# Patient Record
Sex: Male | Born: 1950 | Race: White | Hispanic: No | Marital: Married
Health system: Southern US, Community
[De-identification: ages and names within clinical notes are randomized; demographics above are authoritative.]

## PROBLEM LIST (undated history)

## (undated) DIAGNOSIS — M129 Arthropathy, unspecified: Secondary | ICD-10-CM

## (undated) DIAGNOSIS — M1710 Unilateral primary osteoarthritis, unspecified knee: Secondary | ICD-10-CM

## (undated) DIAGNOSIS — M1711 Unilateral primary osteoarthritis, right knee: Secondary | ICD-10-CM

---

## 2011-05-10 LAB — PROTIME-INR
INR: 1 (ref 0.9–1.1)
Protime: 13 seconds (ref 11.6–14.4)

## 2011-05-10 LAB — APTT: aPTT: 28.5 seconds (ref 24.3–33.1)

## 2011-05-10 NOTE — Progress Notes (Signed)
[facility] PRE-SURGICAL TESTING INSTRUCTIONS     Date of Procedure 05/11/11 Time of Procedure 8:00 a.m.    PRIOR TO PROCEDURE DATE:  1. Arrange for someone to take you home and be with you after discharge since you cannot drive after receiving sedation. Please ensure it is someone we can share information with regarding your discharge.    2. Contact your doctor for advice if:       a. You are taking any blood thinners, aspirin, anti-inflammatory or vitamin E products.       b. There is a change in your physical state such as a cold, fever, rash, cuts, sores or infection, especially near your surgical site.    3. Do not drink alcohol the day before your surgery.    4. Please follow guidelines prior to surgery for diet, medication, or preparations as advised by your doctor.    5. FOLLOW INSTRUCTIONS FOR ARRIVAL TIME AS DIRECTED BY YOUR DOCTOR.  If your doctor does not give you a specific arrival time, please arrive at 6:00 a.m.    THE DAY OF YOUR PROCEDURE:  1. Please report to registration desk at the WEST entrance on the day of surgery.     2. DO NOT EAT OR DRINK ANYTHING AFTER MIDNIGHT. The only exception would be a small sip of water with any medications you were told to take the morning of surgery.         a. Medication instructions for the day of surgery: Take Losartan.       Physician signature______________________Date___________Time________    3. Do not swallow water when brushing teeth. No gum, candy, mints or ice chips. Refrain from smoking or at least decrease the amount.    4. Dress in loose, comfortable clothing appropriate for redressing after your procedure.  Do not wear jewelry, make-up, fingernail polish, lotion, powders or metal hairclips.    5. Dentures and glasses may need to be removed before surgery. Bring a case for your glasses or contacts. If you use a CPAP, please bring it with you the day of surgery.    6. Leave any valuables at home such as credit cards, cash, cell phones and jewelry.  The hospital will not be responsible for valuables that are not secured in the hospital safe.    7. You may bring a bag with personal items if you are to spend the night. Please have any large items you may need brought in by your family after your arrival to your hospital room.    8. Please bring a copy of any history and physical form, or medical reports your doctor may have given you.    9. If you have a Living Will or Durable Power of Attorney, please bring a copy on the day of surgery.    HOW WE KEEP YOU SAFE and WORK TO PREVENT SURGICAL SITE INFECTIONS:  1. Health care workers should always check your ID bracelet to verify your name and birth date. You will be asked many times to state your name, date of birth, and allergies.    2. Health care workers should always clean their hands with soap or alcohol gel before providing care to you. It is okay to ask anyone if they cleaned their hands before they touch you.    3. You will be actively involved in verifying the type of surgery you are having and ensuring the correct surgical site is confi rmed.    4. Do not shave near  where you will have surgery. Shaving with a razor can irritate your skin and make it easier to develop an infection. On the day of your procedure, any hair that needs to be removed near the surgical site will be 'clipped' by a healthcare worker using a special clippers designed to avoid skin irritation.    5. When you are in the operating room, your surgical site will be cleansed with a special soap and in most cases you will be given an antibiotic before the surgery begins.    AFTER YOUR PROCEDURE:  1. For comfort and safety, arrange to have someone at home with you for the first 24 hours after discharge.    2. You and your family will be given written instructions about your diet, activity, dressing care, medications, and return visits.    3. Always clean your hands before and after caring for your wound.    4. Mild nausea, headache, muscle  aches, sore throat, or fatigue may occur after anesthesia. Should any of these symptoms become severe, or should you notice any signs of infection, you should call your doctor.    SPECIAL INSTRUCTIONS     ADDITIONAL INFORMATION REVIEWED:  Yes Taking Control of Your Pain  Yes FAQs about "Surgical Site Infections "  Yes Total Joint Packet-Please bring this packet back  Yes Same Day Service Booklet on the day of your surgery  Yes Hibiclens Bathing Instructions       If you need to contact us for any reason, please call us at [phone]  [facility].05/10/2011 .10:01 AM    Instructions reviewed and copy given to patient during visit.

## 2011-05-10 NOTE — Anesthesia Pre-Procedure Evaluation (Signed)
Vernon Chen     Anesthesia Evaluation     Patient summary reviewed and Nursing notes reviewed    No history of anesthetic complications   Airway   Mallampati: I  TM distance: >3 FB  Neck ROM: full  Dental - normal exam     Pulmonary - negative ROS   Cardiovascular   (+) hypertension well controlled,     Neuro/Psych - negative ROS   GI/Hepatic/Renal    (+) GERD,     Endo/Other  (+) , arthritis (Degenerative arthritis left knee)    Comments: Obese  Abdominal               Surgeon: Army Melia. 05/11/11. Procedure: Left knee total arthroplasty    Allergies: Review of patient's allergies indicates no known allergies.    NPO Status:                              Anesthesia Plan    ASA 3     general     intravenous induction   Anesthetic plan and risks discussed with patient.          Lesly Rubenstein  05/10/2011

## 2011-05-10 NOTE — Progress Notes (Signed)
Identify the learner who is being assessed for education: Patient                       Ability to Learn:  Exhibits ability to grasp concepts and respond to questions: High  Ready to Learn: Yes  calm   Preferred Method of Learning:  written  Barriers to Learning: Verbalizes interest  Special Considerations due to cultural, religious, spiritual beliefs:  No  Language:  English  Language Interpreter:  No    [facility] Perioperative Care Plan Goals  [x]  Appropriate evaluation / integration of data as delineated by ASPAN Standards of Perianesthesia Nursing Practice    Pain scale and pain management   [x] Patient verbalizes understanding of pain scale and pain management  [x] Pre-operative determination of patient???s anticipated Post-Operative pain goal:   less than 4 of 10 on 10 point scale post op goal  []  Other     Medication(s) - Compliance with preop medication instructions  [x]  Patient verbalizes understanding of preop medications (see [facility] Presurgical Instructions)    Instructions, Pre op                                                                                            [x]  Patient verbalizes understanding of presurgical instructions as reviewed with phone interview nurse or in-person nurse review    Fall Risk Potential, Preoperatively                                                                                   [x] No preoperative risk identified  [] Preop risk identified:        [] Sensory deficit        [] Motor deficit        [] Balance problem        [] Home medication        [] Uses assistive device    Goal(s) for fall prevention:  [] Prevent fall or injury by requesting assistance with activities of daily living  [] Patient / Significant other verbalizes understanding the need to call for assistance prior to getting out of bed during hospitalization      Infection Precautions                                                                                            [x]   Patient understands implementation of infection precautions (see [facility] Presurgical Instructions)    Patient Safety  [x]  Patient identification  verified  [x]  Site verified    Instructions - Discharge Planning for Outpatients  [x]  Patient / significant other voices understanding of home care and follow up procedures  [x]  Encourage patient / significant other to review discharge instructions the day after procedure due to sedation on day of surgery    Anticipated Special Needs upon discharge:        []  Cooling device        []  Crutches       []  Walker        []  Wound Support device        []  Drain        []  Other       Instructions - Discharge Planning for Admitted patients  [x]  Patient / significant other understands plan for admission after surgery  [x]  Patient / significant other understands plan for anticipated discharge dispostion        05/10/2011 10:00 AM Jannifer Hick, RN

## 2011-05-10 NOTE — Progress Notes (Signed)
Notified Lupita Leash, Dr. Bryson Ha office, patient has been taking Mobic daily through today and Fish Oil 500 mg every other day.

## 2011-05-11 ENCOUNTER — Inpatient Hospital Stay
Admit: 2011-05-11 | Disposition: A | Discharge status: Home / Self Care | Source: Ambulatory Visit | Attending: Sports Medicine | Admitting: Sports Medicine

## 2011-05-11 LAB — PROTIME-INR
INR: 1 (ref 0.9–1.1)
Protime: 13.4 seconds (ref 11.6–14.4)

## 2011-05-11 LAB — APTT: aPTT: 27 seconds (ref 24.3–33.1)

## 2011-05-11 MED ORDER — ONDANSETRON HCL 4 MG/2ML IJ SOLN
4 | Freq: Once | INTRAMUSCULAR | Status: DC | PRN
Start: 2011-05-11 — End: 2011-05-11

## 2011-05-11 MED ORDER — MIDAZOLAM HCL 2 MG/2ML IJ SOLN
2 | Freq: Once | INTRAMUSCULAR | Status: AC | PRN
Start: 2011-05-11 — End: 2011-05-11

## 2011-05-11 MED ORDER — PNEUMOCOCCAL 23-POLYVALENT VACC INJ
25 | Freq: Once | INTRAMUSCULAR | Status: DC
Start: 2011-05-11 — End: 2011-05-12

## 2011-05-11 MED ADMIN — famotidine (PEPCID) injection 20 mg: INTRAVENOUS | @ 11:00:00 | NDC 63323073912

## 2011-05-11 MED ADMIN — pantoprazole (PROTONIX) tablet 40 mg: ORAL | @ 20:00:00 | NDC 00008060704

## 2011-05-11 MED ADMIN — warfarin (COUMADIN) tablet 5 mg: ORAL | @ 22:00:00 | NDC 00056017201

## 2011-05-11 MED ADMIN — lactated ringers infusion: INTRAVENOUS | @ 11:00:00 | NDC 00338011704

## 2011-05-11 MED ADMIN — midazolam (VERSED) injection 2 mg: INTRAVENOUS | @ 12:00:00 | NDC 63323041112

## 2011-05-11 MED ADMIN — metoclopramide (REGLAN) injection 10 mg: INTRAVENOUS | @ 11:00:00 | NDC 00409341401

## 2011-05-11 MED ADMIN — dexamethasone (DECADRON) injection 10 mg: INTRAVENOUS | @ 17:00:00 | NDC 63323016505

## 2011-05-11 MED ADMIN — dextrose 5 % and 0.45 % NaCl with KCl 20 mEq infusion: INTRAVENOUS | @ 16:00:00 | NDC 00338067104

## 2011-05-11 MED ADMIN — LORazepam (ATIVAN) 2 MG/ML injection: INTRAVENOUS | @ 15:00:00 | NDC 17478004001

## 2011-05-11 MED ADMIN — ceFAZolin (ANCEF) 2 g in dextrose 5% 100 mL IVPB: INTRAVENOUS | @ 20:00:00 | NDC 09999990046

## 2011-05-11 MED ADMIN — ceFAZolin (ANCEF) 2 g in dextrose 5% 100 mL IVPB: INTRAVENOUS | @ 12:00:00 | NDC 09999990046

## 2011-05-11 MED ADMIN — morphine (PF) injection 4 mg: 4 mg | INTRAVENOUS | @ 18:00:00 | NDC 00409189101

## 2011-05-11 MED ADMIN — HYDROmorphone (DILAUDID) injection 0.4 mg: 0.2 mg | INTRAVENOUS | @ 16:00:00 | NDC 00409128331

## 2011-05-11 MED FILL — CEFAZOLIN 2000 MG IN D5W 100 ML IVPB: Qty: 2

## 2011-05-11 MED FILL — FENTANYL CITRATE 0.05 MG/ML IJ SOLN: 0.05 MG/ML | INTRAMUSCULAR | Qty: 5

## 2011-05-11 MED FILL — METOCLOPRAMIDE HCL 5 MG/ML IJ SOLN: 5 mg/mL | INTRAMUSCULAR | Qty: 2

## 2011-05-11 MED FILL — PNEUMOVAX 23 25 MCG/0.5ML IJ INJ: 25 MCG/0.5ML | INTRAMUSCULAR | Qty: 0.5

## 2011-05-11 MED FILL — GENTAMICIN SULFATE 40 MG/ML IJ SOLN: 40 MG/ML | INTRAMUSCULAR | Qty: 6

## 2011-05-11 MED FILL — ZEMURON 50 MG/5ML IV SOLN: 50 MG/5ML | INTRAVENOUS | Qty: 5

## 2011-05-11 MED FILL — MORPHINE SULFATE (PF) 10 MG/ML IV SOLN: 10 MG/ML | INTRAVENOUS | Qty: 1

## 2011-05-11 MED FILL — EPHEDRINE SULFATE 50 MG/ML IJ SOLN: 50 MG/ML | INTRAMUSCULAR | Qty: 1

## 2011-05-11 MED FILL — KCL IN DEXTROSE-NACL 20-5-0.45 MEQ/L-%-% IV SOLN: INTRAVENOUS | Qty: 1000

## 2011-05-11 MED FILL — VANCOMYCIN HCL 1000 MG IV SOLR: 1000 MG | INTRAVENOUS | Qty: 1

## 2011-05-11 MED FILL — MIDAZOLAM HCL 2 MG/2ML IJ SOLN: 2 mg/mL | INTRAMUSCULAR | Qty: 2

## 2011-05-11 MED FILL — LIDOCAINE HCL (CARDIAC) 20 MG/ML IV SOLN: 20 MG/ML | INTRAVENOUS | Qty: 5

## 2011-05-11 MED FILL — MORPHINE SULFATE (PF) 1 MG/ML IJ SOLN: 1 MG/ML | INTRAMUSCULAR | Qty: 5

## 2011-05-11 MED FILL — CYKLOKAPRON 100 MG/ML IV SOLN: 100 MG/ML | INTRAVENOUS | Qty: 10

## 2011-05-11 MED FILL — PROTONIX 40 MG PO TBEC: 40 mg | ORAL | Qty: 1

## 2011-05-11 MED FILL — FAMOTIDINE 10 MG/ML IV SOLN: 10 mg/mL | INTRAVENOUS | Qty: 2

## 2011-05-11 MED FILL — PROPOFOL 10 MG/ML IV EMUL: 10 MG/ML | INTRAVENOUS | Qty: 50

## 2011-05-11 MED FILL — MORPHINE SULFATE (PF) 4 MG/ML IV SOLN: 4 mg/mL | INTRAVENOUS | Qty: 1

## 2011-05-11 MED FILL — COUMADIN 5 MG PO TABS: 5 mg | ORAL | Qty: 1

## 2011-05-11 MED FILL — DEXAMETHASONE SODIUM PHOSPHATE 4 MG/ML IJ SOLN: 4 MG/ML | INTRAMUSCULAR | Qty: 5

## 2011-05-11 MED FILL — ANECTINE 20 MG/ML IJ SOLN: 20 MG/ML | INTRAMUSCULAR | Qty: 10

## 2011-05-11 MED FILL — HYDROMORPHONE HCL 2 MG/ML IJ SOLN: 2 MG/ML | INTRAMUSCULAR | Qty: 1

## 2011-05-11 MED FILL — PHENYLEPHRINE HCL 10 MG/ML IJ SOLN: 10 MG/ML | INTRAMUSCULAR | Qty: 1

## 2011-05-11 MED FILL — LORAZEPAM 2 MG/ML IJ SOLN: 2 mg/mL | INTRAMUSCULAR | Qty: 1

## 2011-05-11 MED FILL — KCL IN DEXTROSE-NACL 20-5-0.45 MEQ/L-%-% IV SOLN: 20-5-0.45 MEQ/L-%-% | INTRAVENOUS | Qty: 1000

## 2011-05-11 NOTE — Plan of Care (Signed)
Identify the learner who is being assessed for education: Patient  Ability to Learn:  Exhibits ability to grasp concepts and respond to questions: High  Ready to Learn: Yes  calm  Preferred Method of Learning:  verbal  Barriers to Learning: Verbalizes interest  Special Considerations due to cultural, religious, spiritual beliefs:  no  Language: [place]  Language Interpreter: no    [facility] Perioperative Care Plan Goals  [x] Appropriate evaluation / integration of data as delineated by ASPAN Standards of Perianesthesia Nursing Practice.    Pain scale and pain management  [x] Patient will verbalize understanding of pain scale and pain management.  [x] Pre-operative determination of patient???s anticipated Post-Operative pain goal: 4 of 10 on 10 point scale post op goal  [x] Patient will verbalize plan for pain management  [] Other     Compliance with Pre-op Instructions - Patient reports compliance with:  [x] Taking prescribed home meds before arrival  [x] Surgical prep instructions specific to the patient's surgery    Fall Risk - PreOperatively   [] No preoperative risk identified  [x] Preop risk identified:     []  Sensory deficit     []  Motor deficit     []  Balance problem     []  Home medication     [] Uses assistive device to ambulate  [x] Due to Perioperative medication administration    Goal(s) for fall prevention:  [x]  Prevent fall or injury by use of encouraging call light for assistance, side rails, and assisting with activity.  [x] Patient / Significant other verbalize understanding of need to call for assistance prior to getting out of bed.    Infection Precautions                                                                                                   [x]  Patient understands implementation of infection precautions  [x]  Handwashing, skin prep prior to IV insertion, hair clipped at surgical site if needed  [x]  Pre op antibiotic, if ordered    Patient Safety  [x]  Patient identification  [x]  Site  verification (See Universal Protocol Checklist)  [x]  General Safety precautions  [x]  Side rails up, bed/stretcher low position and wheels locked  [x]  Call light within reach  [x]  Patient instructed to call for assistance prior to getting out of bed    Instructions - Discharge planning for Outpatients  [x]  Patient / significant other voices understanding of home care and follow up procedures.  Anticipated Special Needs upon discharge:  []  Cooling device  []  Crutches  []  Walker  []  Wound Support device   [] Drain  [] Other   []  See [facility] Discharge Instructions    Instructions - Discharge planning for Admitted Patients  [x]  Patient / Significant other understands plan for admission after surgery  []  Patient / Significant other understands plan for anticipated discharge disposition                   05/11/2011 6:30 AM Vernon Scarlet, RN

## 2011-05-11 NOTE — Progress Notes (Signed)
Ancef 2 gm sent to OR

## 2011-05-11 NOTE — Progress Notes (Signed)
Pt took Coumadin last night as instructed per Dr Army Melia.

## 2011-05-11 NOTE — Anesthesia Pre-Procedure Evaluation (Addendum)
Vernon Chen    Anesthesia Evaluation     Chart reviewed.   Talked with pt.'s wife & daughter in [place].  Pt. Snoring after IV Versed.  Possibly has undiagnosed sleep apnea.  Snores at night & is tired during day per wife.  Otherwise clinically unchanged since PAT.    Allergies: Review of patient's allergies indicates no known allergies.    NPO Status: Time of last liquid consumption: 0400 (sip H2O with meds)                       Time of last solid food consumption: 2130    Anesthesia Plan  GA  Knows what to expect with plan, risk & personnel involved.  Agrees with plan.    Vernon Chen  05/11/2011

## 2011-05-11 NOTE — Consults (Addendum)
Internal Medicine Consult H&P      Patient Name: Vernon Chen                 Room/Bed: 3E-3104/3104-01   Today's Date: May 11, 2011  Date of Birth: [birth year]           60 y.o. male  Attending Physician: Dr. Ermalene Postin  PCP: No primary provider on file.  Admitted for: LEFT KNEE OSTEOARTHRITIS   Primary Diagnosis: L knee OA  Admission Date: 05/11/2011  5:23 AM  Hospital Day: 1      CC: Post-op Medical Mgmt s/p L-TKR      HPI:  The pt is a 60-yo WM w/a PMH s/f severe OA of both knees who presented to [place] today for a planned/elective L-TKR by Dr. Army Melia. He states that he used to be an avid bicycler, but due to the knee OA, he has not been able to ride in the past year and has gained considerable weight, which he says is affecting other aspects of his health, such as snoring at night. He states that he has had knee problems for at least the past 7 years. He states that in August of 2011, he had L knee arthroscopy, which was complicated by septic arthritis, leading to septic shock and a stay in the ICU for 3 weeks. He states that he lives in the [place], [place] area and is here in [place] specifically for the surgery with Dr. Army Melia. He states that he will be here for another two weeks for intensive PT. He also has a hx of HTN, HLD, b/l tinnitus, and GERD. He is feeling well post-op, with no major complaints. He states he has a hx of a heart murmur since childhood, but was evaluated by a cardiologist and was reassured that it was just a "functional murmur." I did not detect the murmur on exam.      PMH:  Past Medical History   Diagnosis Date   ??? Hypertension    ??? Hyperlipidemia    ??? Murmur    ??? GERD (gastroesophageal reflux disease)          PSH:  Past Surgical History   Procedure Date   ??? Knee arthroscopy      also revision for sepsis   ??? Hand surgery    ??? Tumor removal    ??? Colonoscopy    ??? Knee surgery 05/11/2011     LEFT TOTAL KNEE ARTHROPLASTY                   Social Hx:  History     Social History   ???  Marital Status: Married     Spouse Name: N/A     Number of Children: N/A   ??? Years of Education: N/A     Occupational History   ??? Not on file.     Social History Main Topics   ??? Smoking status: Never Smoker    ??? Smokeless tobacco: Not on file   ??? Alcohol Use:    ??? Drug Use:    ??? Sexually Active:      Other Topics Concern   ??? Not on file     Social History Narrative   ??? No narrative on file         Family Hx:  History reviewed. No pertinent family history.        MEDICATIONS:     Continuous Infusions:      ??? lactated ringers 125  mL/hr at 05/11/11 0701   ??? dextrose 5% and 0.45% NaCl with KCl 20 mEq 1,000 mL (05/11/11 1138)       Scheduled Medications:      ??? tranexamic (CYKLOKAPRON) irrigation  1,000 mg Irrigation Once   ??? ortho mix (with morphine) injection   Injection On Call   ??? sodium chloride (PF)  10 mL Intravenous Q12H [facility]   ??? ceFAZolin (ANCEF) IVPB  2 g Intravenous Q8H   ??? docusate sodium  100 mg Oral BID   ??? warfarin  5 mg Oral Daily   ??? dexamethasone  10 mg Intravenous Q12H   ??? dexamethasone  6 mg Intravenous Q12H   ??? losartan  50 mg Oral Daily   ??? simvastatin  40 mg Oral Nightly   ??? HYDROmorphone       ??? pantoprazole  40 mg Oral QAM AC   ??? pneumococcal vaccine  0.5 mL Intramuscular Once       PRN Medications:      sodium chloride (PF) 10 mL PRN   acetaminophen 650 mg Q4H PRN   ondansetron 4 mg Q6H PRN   oxyCODONE-acetaminophen 1 tablet Q4H PRN   oxyCODONE-acetaminophen 2 tablet Q4H PRN   morphine 4 mg Q2H PRN   magnesium hydroxide 10 mL Daily PRN   lorazepam 0.5 mg Q4H PRN        Home Medications:   Prior to Admission medications    Medication Sig Start Date End Date Taking? Authorizing Provider   Meloxicam (MOBIC PO) Take 15 mg by mouth daily.   Yes Historical Provider, MD   LOSARTAN POTASSIUM PO Take 50 mg by mouth daily.   Yes Historical Provider, MD   SIMVASTATIN PO Take 40 mg by mouth daily.   Yes Historical Provider, MD   Omega-3 Fatty Acids (FISH OIL) 500 MG CAPS Take  by mouth daily.     Yes  Historical Provider, MD   omeprazole (PRILOSEC) 20 MG capsule Take 20 mg by mouth daily.     Yes Historical Provider, MD   Multiple Vitamin (MULTIVITAMIN PO) Take  by mouth daily.      Historical Provider, MD       Allergies: Review of patient's allergies indicates no known allergies.  -------------------------------------------------------------------------------------------------------------------  VITAL SIGNS:    Current Vitals:   Filed Vitals:    05/11/11 1854   BP: 113/74   Pulse: 97   Temp: 98.2 ??F (36.8 ??C)   Resp: 12     Temperature Range: Temp  Avg: 98.1 ??F (36.7 ??C)  Min: 97.2 ??F (36.2 ??C)  Max: 98.7 ??F (37.1 ??C)    Respiratory Rate Range: Resp  Avg: 14.9   Min: 12   Max: 18     Pulse Range: Pulse  Avg: 98.3   Min: 68   Max: 111     Blood Pressure Range:  Systolic (24hrs), Avg:131 mmHg, Min:113 mmHg, Max:168 mmHg  ;Diastolic (24hrs), Avg:82 mmHg, Min:69 mmHg, Max:93 mmHg      Pulse Ox Range: SpO2  Avg: 96.3 %  Min: 94 %  Max: 100 %    Vitals of Past 24 Hours: Patient Vitals for the past 24 hrs:   BP Temp Temp src Pulse Resp SpO2 Height Weight   05/11/11 1854 113/74 mmHg 98.2 ??F (36.8 ??C) Oral 97  12  95 % - -   05/11/11 1534 116/69 mmHg 97.2 ??F (36.2 ??C) Oral 84  18  95 % - -   05/11/11 1329 135/88  mmHg 98 ??F (36.7 ??C) Oral 105  16  95 % - -   05/11/11 1300 124/88 mmHg 98 ??F (36.7 ??C) Oral 111  18  97 % - -   05/11/11 1230 138/72 mmHg - - 109  12  96 % - -   05/11/11 1200 138/86 mmHg - - 109  13  97 % - -   05/11/11 1145 126/86 mmHg - - 108  12  95 % - -   05/11/11 1130 131/83 mmHg - - 105  14  96 % - -   05/11/11 1115 128/79 mmHg - - 107  16  97 % - -   05/11/11 1101 129/81 mmHg 98.7 ??F (37.1 ??C) Temporal 106  14  100 % - -   05/11/11 0736 - - - - - 98 % - -   05/11/11 0700 - - - 68  16  - - -   05/11/11 0606 168/93 mmHg 98.2 ??F (36.8 ??C) Oral 70  18  94 % - -   05/11/11 0559 - - - - - - 5' 8.5" (1.74 m) 249 lb (112.946 kg)    -------------------------------------------------------------------------------------------------------------------  INS & OUTS:      Intake/Output Summary (Last 24 hours) at 05/11/11 1936  Last data filed at 05/11/11 1900   Gross per 24 hour   Intake   2545 ml   Output    850 ml   Net   1695 ml       I/O last 3 completed shifts:  In: 2345 [P.O.:20; I.V.:2325]  Out: 850 [Urine:750; Blood:100]    I/O this shift:  In: 200 [P.O.:200]  Out: -     Diet: General    Net Fluid Balance Since Admission: +1695 mL.  -------------------------------------------------------------------------------------------------------------------  PHYSICAL EXAM:    General: obese middle-aged WM lying in bed in NAD.  HEENT: PERRL, EOMI.  Neck: no JVD.  Lungs: CTAB.  Heart: tachy but regular, no M.  Abdomen: obese, NTND.  Extremities: L leg in ACE wrap. R leg in SCD.  Skin: no rashes, bruises, or lacerations.  Neurologic: grossly intact  Psych: A&O  -------------------------------------------------------------------------------------------------------------------  LABS:    INR:   Recent Labs   Basename 05/11/11 1345 05/10/11 1620    INR 1.0 1.0     APTT 27.0    -------------------------------------------------------------------------------------------------------------------  MICROBIOLOGY:  - none  -------------------------------------------------------------------------------------------------------------------  RADIOLOGY:  - none  -------------------------------------------------------------------------------------------------------------------  CARDIOLOGY:  - none  -------------------------------------------------------------------------------------------------------------------      Assessment and Plan:     OJAS COONE is a 60 y.o. male, who was admitted for elective L-TKR.      Post-Op Pain Mgmt and DVT Ppx.  - per ortho      Tachycardia.  - HR has been ~100. Etiology not clear. No EKG. Pt does not appear volume-depleted. Has been  trending down. Perhaps post-op pain or stress.  - monitor with q4h vitals.      HTN.  - continue home losartan.      HLD.  - continue home Zocor.      GERD.  - home Prilosec 20 mg has been replaced with hospital-ordered Protonix 40 mg PO.      Discussed with Dr. Ermalene Postin.  -------------------------------------------------------------------------------------------------------------------  Cecille Po, MD, PGY2  Pager: [phone]  05/11/2011 7:36 PM  Hospitalist Progress Note  05/11/2011 10:12 PM      Addendum to AI (serving as scribe)/Resident H& P:  Pt seen,examined and evaluated the patient with AI/resident. I have reviewed the current history, physical findings, labs and assessment and plan and agree with note as documented including:    Tachycardia resolved. Continue home medications for hypertension, hyperlipidemia and GERD.     Arvin Collard M.D

## 2011-05-11 NOTE — Progress Notes (Signed)
Pt admitted to PACU from OR., with oral airway in place. Awakens quickly airway removed. O2 5lnc then reduced to NC 3L sats 94%. Left foot PWD, 2+ pedal pulse, 3/5 dorsi/plantar flexion.

## 2011-05-11 NOTE — Progress Notes (Signed)
[facility] CRNA called reported she did NOT give Decadron 10 mg in OR and requested it be given in PACU. Decadron10mg  IVP given at 1300, RN Boneta Lucks on 3E notified will stagger dose scheduled on floor for 2000pm  Till 0100 (12 hrs) after 1st dose.

## 2011-05-11 NOTE — Anesthesia Post-Procedure Evaluation (Signed)
Anesthesia Post-op Note    Patient: Vernon Chen  Dr. Mendel Corning,   05/11/11    Procedure(s) Performed: Vernon Chen. Total Knee Arthroplasty    Anesthesia type: General      Post-op assessment:  Anesthetic Problems: no   Last Vitals:  height is 5' 8.5" (1.74 m) and weight is 249 lb (112.946 kg). His temporal temperature is 98.7 ??F (37.1 ??C). His blood pressure is 129/81 and his pulse is 106. His respiration is 14 and oxygen saturation is 100%.   Cardiovascular System Stable: yes  Respiratory Function: Airway Patent yes  ETT no  Ventilator no  Level of consciousness: awake and oriented  Post-op pain: Adequate analgesia  Hydration Adequate: yes  Nausea/Vomiting:no          Kaisa Wofford  11:27 AM

## 2011-05-11 NOTE — Brief Op Note (Signed)
Brief Postoperative Note    Vernon Chen  Date of Birth:  March 31, 1951  [id number]    Pre-operative Diagnosis: left knee osteoarthrtitis    Post-operative Diagnosis: Same    Procedure: left total knee arthroplasty    Anesthesia: general    Surgeons/Assistants: Dr. Army Melia, Dr. Ysidro Evert    Estimated Blood Loss: less than 50     Complications: none    Specimens: were not obtained      Vernon Chen

## 2011-05-11 NOTE — Progress Notes (Signed)
Pt awakened for reassessment. Left foot PWD, 2+ pulse,+movement, + sensation, 5/5 plantar dorsi flexion. VVV, chaneged to every 30 min BP. Wife and daughter will be phoned when pt room ready.

## 2011-05-11 NOTE — Progress Notes (Signed)
PACU Transfer Note    Filed Vitals:    05/11/11 1300   BP: 124/88   Pulse: 111   Temp: 98 ??F (36.7 ??C)   Resp: 18         Intake/Output Summary (Last 24 hours) at 05/11/11 1327  Last data filed at 05/11/11 1256   Gross per 24 hour   Intake   2045 ml   Output    100 ml   Net   1945 ml       Pain assessment:  present - adequately treated  Pain Level: 4 (pt sleeping/snoring)    Report called to Receiving unit RN.    05/11/2011 1:27 PM         PACU Transfer Note    Filed Vitals:    05/11/11 1300   BP: 124/88   Pulse: 111   Temp: 98 ??F (36.7 ??C)   Resp: 18         Intake/Output Summary (Last 24 hours) at 05/11/11 1327  Last data filed at 05/11/11 1256   Gross per 24 hour   Intake   2045 ml   Output    100 ml   Net   1945 ml       Pain assessment:  present - adequately treated  Pain Level: 4 (pt sleeping/snoring)    Report called to Receiving unit RN. Larita Fife and Boneta Lucks    05/11/2011 1:27 PM

## 2011-05-11 NOTE — Plan of Care (Signed)
Problem: Falls - Risk of  Goal: Absence of falls  Outcome: Ongoing  Free from injury or falls at this time, fall precautions in place, Morse Fall Risk: High (45 and higher), bed alarm on, reoriented to room and call light, reminded not to get up without assistance, call light in reach, will continue to monitor   Geralyn Corwin, RN       Problem: Pain Control  Goal: Maintain pain level at or below patient's acceptable level (or 5 if patient is unable to determine acceptable level)  Outcome: Ongoing  C/o left knee pain . Given morphine and reported pain level less afterwards. Will monitor pain level.  Geralyn Corwin, RN  05/11/2011.3:35 PM        Problem: Neurological  Goal: Maximum potential motor/sensory/cognitive function  Outcome: Ongoing  Denies numbness and tingling in left extremity. circ check within normal. Will monitor neurovascular checks. Has voided about urine already.   Geralyn Corwin, RN  05/11/2011.3:37 PM        Problem: Skin Integrity/Risk  Goal: Wound healing  Outcome: Ongoing  Dressing cdi to left knee and ice to site.monitor dressing for drainage.  Geralyn Corwin, RN  05/11/2011.3:38 PM

## 2011-05-11 NOTE — Progress Notes (Signed)
Room ready, pt awakened notified of transfer returns immed to sleep. Assessment stable. Left leg ted with family at hotel.

## 2011-05-11 NOTE — H&P (Signed)
05/11/2011   Orthopedic H & P Update                    (  < 30 days since last complete H & P )                   Patient Vernon Chen/ 05/11/2011                         Diagnosis: Increasing / worsening & evolving, non-traumatic Osteoarthritic Pain, despite  more conservative modalities, including  oral analgesics / ( LEFT knee joint  ).                                 #2. Previous history of " septic LEFT knee, treated with IV-ABX @ [facility], several years ago."         PMHX:   Past Medical History   Diagnosis Date   . Hypertension    . Hyperlipidemia    . Murmur    . GERD (gastroesophageal reflux disease)         PSHX:   Past Surgical History   Procedure Date   . Knee arthroscopy      also revision for sepsis   . Hand surgery    . Tumor removal    . Colonoscopy        Social Hx:  reports that he has never smoked. He does not have any smokeless tobacco history on file.    Occupational Impact:                         Home Meds:   Prior to Admission medications    Medication Sig Start Date End Date Taking? Authorizing Provider   Meloxicam (MOBIC PO) Take 15 mg by mouth daily.   Yes Historical Provider, MD   LOSARTAN POTASSIUM PO Take 50 mg by mouth daily.   Yes Historical Provider, MD   SIMVASTATIN PO Take 40 mg by mouth daily.   Yes Historical Provider, MD   Omega-3 Fatty Acids (FISH OIL) 500 MG CAPS Take  by mouth daily.     Yes Historical Provider, MD   omeprazole (PRILOSEC) 20 MG capsule Take 20 mg by mouth daily.     Yes Historical Provider, MD   Multiple Vitamin (MULTIVITAMIN PO) Take  by mouth daily.      Historical Provider, MD                          Allergies: Review of patient's allergies indicates no known allergies.                         Exam: NAD                      VS-  BP 168/93  Pulse 70  Temp(Src) 98.2 F (36.8 C) (Oral)  Resp 18  Ht 5' 8.5" (1.74 m)  Wt 249 lb (112.946 kg)  BMI 37.31 kg/m2  SpO2 94%              General:  Alert and aware of today's  Pending procedure.                   Heent: Atraumatic   / no  acute changes.                   Neck: Supple, midline trachaea, normal swallowing observed.                   C V:  RRR, no Murmurs.             Lungs: CTA, No wheezes, Ronchi, or Crackles.                    Abdomen:  + BS, Soft, ND, NT and no appliances.      Skin: Warm / Dry. No skin tears.                      Extremities: Symmetrical, no edema, palpable bilateral distal pulses.                  Neurologic: Pupils =, round, clear voice and patient expresses understanding of questions / pending procedure.                       Today's labs: CBC:   No results found for this basename: WBC, RBC, HGB, HCT, MCV, MCH, MCHC, RDW, PLT, MPV     CMP:    No results found for this basename: NA, K, CL, CO2, BUN, CREATININE, GFRAA, AGRATIO, LABGLOM, GLUCOSE, GLU, PROT, LABALBU, CALCIUM, BILITOT, ALKPHOS, AST, ALT                  Assessment: 60 y.o. / male, non-smoker,  Osteoarthritic pain / < ing  ROM, painful ( joint extension / flexion ), rotational  and  weight-bearing pain @ ( LEFT knee joint ).              Plan: (  LEFT ) ( Knee   ) ( Arthroplasty ) procedure, today.      Consults for PT / OT / Social services and Internal Medicine may - / can be requested by surgical services.              Comment: Continue Health Care Coordination / Maintenance / Monitoring, Including ( > BP, wt mgmt ) with  patient's PCP - Advised.                 Cain Sieve, [place]              05/11/2011   Orthopedic H & P Update                    (  < 30 days since last complete H & P )                   Patient Vernon Chen/ 05/11/2011                         Diagnosis: Increasing / worsening & evolving, non-traumatic Osteoarthritic Pain, despite  more conservative modalities, including  oral analgesics / (   ).                                      PMHX:   Past Medical History   Diagnosis Date   . Hypertension    . Hyperlipidemia    . Murmur    . GERD (gastroesophageal reflux disease)  PSHX:    Past Surgical History   Procedure Date   . Knee arthroscopy      also revision for sepsis   . Hand surgery    . Tumor removal    . Colonoscopy        Social Hx:  reports that he has never smoked. He does not have any smokeless tobacco history on file.    Occupational Impact:                         Home Meds:   Prior to Admission medications    Medication Sig Start Date End Date Taking? Authorizing Provider   Meloxicam (MOBIC PO) Take 15 mg by mouth daily.   Yes Historical Provider, MD   LOSARTAN POTASSIUM PO Take 50 mg by mouth daily.   Yes Historical Provider, MD   SIMVASTATIN PO Take 40 mg by mouth daily.   Yes Historical Provider, MD   Omega-3 Fatty Acids (FISH OIL) 500 MG CAPS Take  by mouth daily.     Yes Historical Provider, MD   omeprazole (PRILOSEC) 20 MG capsule Take 20 mg by mouth daily.     Yes Historical Provider, MD   Multiple Vitamin (MULTIVITAMIN PO) Take  by mouth daily.      Historical Provider, MD                          Allergies: Review of patient's allergies indicates no known allergies.                         Exam: NAD                      VS-  BP 168/93  Pulse 70  Temp(Src) 98.2 F (36.8 C) (Oral)  Resp 18  Ht 5' 8.5" (1.74 m)  Wt 249 lb (112.946 kg)  BMI 37.31 kg/m2  SpO2 94%              General:  Alert and aware of today's  Pending procedure.                  Heent: Atraumatic   / no acute changes.                   Neck: Supple, midline trachaea, normal swallowing observed.                   C V:  RRR, no Murmurs.             Lungs: CTA, No wheezes, Ronchi, or Crackles.                    Abdomen:  + BS, Soft, ND, NT and no appliances.      Skin: Warm / Dry. No skin tears.                      Extremities: Symmetrical, no edema, palpable bilateral distal pulses.                  Neurologic: Pupils =, round, clear voice and patient expresses understanding of questions / pending procedure.                       Today's labs: CBC:   No results found for this basename: WBC, RBC,  HGB, HCT, MCV, MCH, MCHC, RDW, PLT, MPV     CMP:    No results found for this basename: NA, K, CL, CO2, BUN, CREATININE, GFRAA, AGRATIO, LABGLOM, GLUCOSE, GLU, PROT, LABALBU, CALCIUM, BILITOT, ALKPHOS, AST, ALT                  Assessment: 60 y.o. OBESE, male, non-smoker, with worsening Osteoarthritic pain / < ing  ROM, painful ( joint extension / flexion ), rotational  and  weight-bearing pain @ (  LEFT knee joint ).              Plan: (  LEFT ) ( Knee  ) ( Arthroplasty) procedure, today.      Consults for PT / OT / Social services and Internal Medicine may - / can be requested by surgical services.              Comment: Continue Health Care Coordination / Maintenance / Monitoring, Including ( > BP and wt mgmt  ) with  patient's PCP - Advised.                 Cain Sieve, PA

## 2011-05-11 NOTE — Plan of Care (Signed)
[facility] PACU Education and Care Plan Goals    Post Operative Pain Management                                                                               [x]  Patient will verbalize understanding of pain scale and pain management.  [x]  Patient achieves predetermined pain goal of 4/10   [x]  Self reports a comfort level acceptable for discharge  []  Other     Fall Risk Potential  [x]  Due to Perioperative medication administration  Additional Risk Identified:   []  Sensory deficit         [x]  Motor deficit         []  Balance problem         []  Home medication         []  Uses assistive device to ambulate    Goal(s) for fall prevention:  [x]  Prevent fall or injury by calling for assistance with activity and use of siderails while hospitalized  [x]  Prevent fall or injury by using assistance with activity after discharge.  [x]  Patient / Significant other verbalize understanding in use of any ordered assistive devices    Mobility Safety/ ADL  [x]  Reach a functional mobility goal within limitations of the procedure.    Infection Precautions                                                                                                            [] Patient understands implementation of infection precautions (see [facility] Instructions and SSI Prevention Handout)    Post operative Assessment and Care                                                             [x]  Standards of care met as delineated by ASPAN.  [x]  Procedure specific goals for care as delineated by additional resources: Genia Del in Clinical Nursing; Bulanick, Nursing Care Plans; Jackey Loge and Jackey Loge, Instructions for Surgery Pts.                                                              Discharge Education and Goals  [x] Patient voices understanding of PACU discharge criteria  [] Outpatient / significant other voices understanding of home care and follow up procedures (See [facility] Procedure Discharge Instructions)  [x]  Patient  / significant other understanding  of Special Needs:  [x]  Cooling device  [] Wound Support Device  []  Crutches   [] Drain    []  Walker   [] Other  [x]  Inpatient / significant other understands the plan for transfer to the inpatient unit

## 2011-05-11 NOTE — Op Note (Signed)
PATIENT NAME:                 PA #:            MR #JAHSI, PADBERG                [id number]       [id number]            SURGEON:                              SURG DATE:  DIS DATE:          Audie Pinto, MD                05/11/2011                     PRIMARY CARE PHYSICIAN:               REFERRING PHYSICIAN:            BARRY TALESNICK                                                       DATE OF BIRTH:   AGE:           PATIENT TYPE:     RM #:              Jan 12, 1951       60             IPJ               JSU                   PREOPERATIVE DIAGNOSIS(ES):  Severe degenerative osteoarthrosis left knee  with associated varus malalignment and flexion contracture.     POSTOPERATIVE DIAGNOSIS(ES):  Severe degenerative osteoarthritis left knee  with associated varus malalignment and flexion contracture.     PROCEDURE(S) PERFORMED:  Left total knee joint replacement with TruMatch  system DePuy rotating platform PCL sacrificing with correction of coronal and  sagittal malalignment.     SURGEON:  Philemon Kingdom, MD     FIRST ASSISTANT:  Dr. Ysidro Evert.     ANESTHESIA:  General.     OPERATIVE INDICATIONS:  This patient understood the associated risks,  benefits and consented to the operative procedure.     OPERATIVE FINDINGS:  1.  Excellent correction of severe flexion contracture and varus malalignment  with excellent match of the TruMatch system.  2.  Severe diffuse degenerative osteoarthrosis, marked contracture lateral  retinaculum requiring patellofemoral balancing and lateral release.  3.  Final instillation of #5 femur, #4 tibia, 38-mm patella with 15-mm insert  with complete balancing, flexion, extension gaps and obtained full extension  without any complications.     DETAILS OF PROCEDURE:  This patient was placed in supine position upon the  operating room table and after satisfactory level of general anesthesia, left  knee was prepped and draped to a sterile surgical field.  In view of the  previous  history of a remote infection, not in the lower extremities,  combined Ancef and vancomycin was used.  Gentamicin was used  in irrigation  fluid.  Antibiotic-impregnated cement was utilized.     Under tourniquet control, a medial parapatellar incision was made with  dissection carried through the skin and subcutaneous tissue, a minimally  invasive approach was utilized.  The patella was everted, cut to a 15-mm base  with a 38-mm patella removing spurs.  The femur was cut with the TruMatch system which was placed on to the distal  femur and provided an excellent fit.  The distal resection was performed  followed by utilization of the rotation pins for the chamfer-cutting jig,  followed by the trial, followed by the center-cutting jig.  The trial was  then placed in the knee joint and gap analysis performed.     The tibia was cut with the TruMatch system.  In addition, the extramedullary  system was utilized to adjust the tibial slope.  The laminar spreader was  utilized to gap the joint open and all spurs removed posteriorly including  meniscus structures with electrocoagulation of meniscus beds.  The final  trial was placed in the knee joint with a #4 tibia plate obtaining full  extension, correction of the varus malalignment to a neutral position with  excellent balancing of the gaps with a 15-mm insert.     The cement technique commenced in a usual manner in a meticulous way after  irrigation, drying of the surfaces followed by the tibia and femur and  removal of excess cement, holding the knee in full extension with the  patellar dome attached.  The tourniquet was deflated, and hemostasis obtained  which was minimal bleeding.  The coagulation _____ was placed in the wound.   Final irrigation proceeded by the final 15-mm AOX polyethylene insert  followed by extensor mechanism balancing with placement of interrupted 0  Ethibond sutures and identification of correct mediolateral glide with a mini  lateral release  required followed by closure of the extensor mechanism and  subcutaneous tissue which proceeded in an orderly manner with #2 Quill, 0  Quill, and 2-0 Quill followed by 3-0 and 4-0 subcutaneous sutures.   Steri-Strips were used for skin.  Ace cotton compression dressing applied.   The patient tolerated the procedure well.  There were no complications.   Minimal blood loss.  Patient returned to the recovery unit in excellent  condition.                                            Audie Pinto, MD     [id number]  DD: 05/11/2011 10:00  DT: 05/11/2011 10:54  Job #: [id number]  CC: Audie Pinto, MD

## 2011-05-11 NOTE — Progress Notes (Signed)
Pt placed in clinical discharge, awaiting room. Pt sleeping snoring. Family members called for brief visit.

## 2011-05-12 LAB — CBC
Hematocrit: 36.7 % — ABNORMAL LOW (ref 38.5–50.0)
Hemoglobin: 13.1 g/dL — ABNORMAL LOW (ref 13.2–17.1)
MCH: 32.8 pg (ref 27.0–33.0)
MCHC: 35.7 g/dL (ref 32.0–36.0)
MCV: 92 fL (ref 80.0–100.0)
MPV: 8 fL (ref 7.5–11.5)
Platelets: 311 10*3/uL (ref 140–400)
RBC: 3.98 10*6/uL — ABNORMAL LOW (ref 4.20–5.80)
RDW: 12.8 % (ref 11.0–15.0)
WBC: 15 10*3/uL — ABNORMAL HIGH (ref 3.8–10.8)

## 2011-05-12 LAB — BASIC METABOLIC PANEL
1/Creatinine: 1.47 ratio
Anion Gap: 12 mmol/L (ref 3–16)
BUN: 13 mg/dL (ref 7–25)
CO2: 24 mmol/L (ref 21–33)
Calcium: 9.3 mg/dL (ref 8.6–10.2)
Chloride: 102 mmol/L (ref 98–110)
Creatinine: 0.68 mg/dL (ref 0.50–1.30)
GFR Est, African/Amer: 145 See note.
GFR Est: 120 See note.
Glucose: 132 mg/dL — ABNORMAL HIGH (ref 65–99)
Potassium: 4.3 mmol/L (ref 3.5–5.3)
Sodium: 138 mmol/L (ref 135–146)

## 2011-05-12 LAB — PROTIME-INR
INR: 1.1 (ref 0.9–1.1)
Protime: 14.5 seconds — ABNORMAL HIGH (ref 11.6–14.4)

## 2011-05-12 MED ADMIN — ceFAZolin (ANCEF) 2 g in dextrose 5% 100 mL IVPB: INTRAVENOUS | @ 04:00:00 | NDC 09999990046

## 2011-05-12 MED ADMIN — losartan (COZAAR) tablet 50 mg: ORAL | @ 13:00:00 | NDC 68084034711

## 2011-05-12 MED ADMIN — warfarin (COUMADIN) tablet 5 mg: ORAL | @ 22:00:00 | NDC 00056017201

## 2011-05-12 MED ADMIN — simvastatin (ZOCOR) tablet 40 mg: ORAL | NDC 68084051311

## 2011-05-12 MED ADMIN — docusate sodium (COLACE) capsule 100 mg: ORAL | NDC 00904224461

## 2011-05-12 MED ADMIN — dexamethasone (DECADRON) injection 10 mg: INTRAVENOUS | @ 05:00:00 | NDC 63323016505

## 2011-05-12 MED ADMIN — pantoprazole (PROTONIX) tablet 40 mg: ORAL | @ 10:00:00 | NDC 00008060704

## 2011-05-12 MED ADMIN — docusate sodium (COLACE) capsule 100 mg: ORAL | @ 13:00:00 | NDC 00904224461

## 2011-05-12 MED ADMIN — sodium chloride (PF) 0.9 % injection 10 mL: INTRAVENOUS | @ 13:00:00

## 2011-05-12 MED ADMIN — morphine (PF) injection 4 mg: 4 mg | INTRAVENOUS | NDC 00409189101

## 2011-05-12 MED ADMIN — HYDROcodone-acetaminophen (NORCO) 5-325 MG per tablet 2 tablet: 2 | ORAL | NDC 00406036562

## 2011-05-12 MED ADMIN — oxyCODONE-acetaminophen (PERCOCET) 5-325 MG per tablet 2 tablet: 2 | ORAL | @ 02:00:00 | NDC 00406051262

## 2011-05-12 MED ADMIN — oxyCODONE-acetaminophen (PERCOCET) 5-325 MG per tablet 2 tablet: 2 | ORAL | @ 10:00:00 | NDC 00406051262

## 2011-05-12 MED ADMIN — oxyCODONE-acetaminophen (PERCOCET) 5-325 MG per tablet 2 tablet: 2 | ORAL | @ 06:00:00 | NDC 00406051262

## 2011-05-12 MED ADMIN — HYDROcodone-acetaminophen (NORCO) 5-325 MG per tablet 1 tablet: 1 | ORAL | @ 19:00:00 | NDC 00406036562

## 2011-05-12 MED FILL — PERCOCET 5-325 MG PO TABS: 5-325 mg | ORAL | Qty: 2

## 2011-05-12 MED FILL — KCL IN DEXTROSE-NACL 20-5-0.45 MEQ/L-%-% IV SOLN: INTRAVENOUS | Qty: 1000

## 2011-05-12 MED FILL — PROTONIX 40 MG PO TBEC: 40 mg | ORAL | Qty: 1

## 2011-05-12 MED FILL — HYDROCODONE-ACETAMINOPHEN 5-325 MG PO TABS: 5-325 mg | ORAL | Qty: 2

## 2011-05-12 MED FILL — COUMADIN 5 MG PO TABS: 5 mg | ORAL | Qty: 1

## 2011-05-12 MED FILL — DOCUSATE SODIUM 100 MG PO CAPS: 100 mg | ORAL | Qty: 1

## 2011-05-12 MED FILL — SIMVASTATIN 40 MG PO TABS: 40 mg | ORAL | Qty: 1

## 2011-05-12 MED FILL — MORPHINE SULFATE (PF) 4 MG/ML IV SOLN: 4 mg/mL | INTRAVENOUS | Qty: 1

## 2011-05-12 MED FILL — PNEUMOVAX 23 25 MCG/0.5ML IJ INJ: 25 MCG/0.5ML | INTRAMUSCULAR | Qty: 0.5

## 2011-05-12 MED FILL — LOSARTAN POTASSIUM 50 MG PO TABS: 50 mg | ORAL | Qty: 1

## 2011-05-12 MED FILL — HYDROCODONE-ACETAMINOPHEN 5-325 MG PO TABS: 5-325 mg | ORAL | Qty: 1

## 2011-05-12 NOTE — Discharge Instructions (Signed)
Warfarin   En Espaol (Spanish Version)    Last modified: 10/05/2009    The following information is an educational aid only. It is not intended as medical advice for individual conditions or treatments. Talk to your doctor, nurse or pharmacist before following any medical regimen to see if it is safe and effective for you.    Pronunciation    (WAR far in)    Pharmacologic Category    Anticoagulant, Coumarin Derivative    Vitamin K Antagonist    U.S[place] Brand Names    Coumadin, Jantoven    [place] Brand Names    Apo-Warfarin, Coumadin, Mylan-Warfarin, Novo-Warfarin, Taro-Warfarin    [place] Brand Names    Coumadin    What key warnings should I know about before taking this medicine?     This medicine may cause severe bleeding. Follow directions for use exactly. Closely review the section in this leaflet which lists when to call healthcare provider.       This medicine does not mix well with some medicines. Serious reactions may occur. Check all medicines with healthcare provider.       Please read the medication guide.    Reasons not to take this medicine     If you have an allergy to warfarin or any other part of this medicine.       Tell healthcare provider if you are allergic to any medicine. Make sure to tell about the allergy and how it affected you. This includes telling about rash; hives; itching; shortness of breath; wheezing; cough; swelling of face, lips, tongue, or throat; or any other symptoms involved.       If you have any of the following conditions: Anesthesia given in your spine, aneurysm, bleeding problems, diverticulitis, drink alcohol to excess, heart valve infection, liver disease, low platelet count, pericarditis, polyarthritis, poor nutrition, recent surgery of the eye or brain, uncontrolled high blood pressure, unsteadiness, or warfarin-induced necrosis.       If you know that you will not take the medicine as directed.       If you are pregnant or may be pregnant.    What is this  medicine used for?    This medicine is used to thin the blood so that clots will not form.    How does it work?    Warfarin changes the body's clotting system. It thins the blood to prevent clots from forming.    How is it best taken?     Use prescription as directed, even if feeling better.       Take this medicine at a similar time of day.       To gain the most benefit, do not miss doses.       Take this medicine with or without food. Take with food if it causes an upset stomach.       Keep vitamin K intake similar from day to day. Talk with nutritionist. Do not make changes in your normal diet. Take limited quantities of green, leafy vegetables (alfalfa, asparagus, broccoli, brussel sprouts, collard greens, cabbage, cauliflower, kale, lettuce, spinach, water cress), green tea, liver, and some vegetable oils. Foods such as these can decrease the effects of warfarin.       Follow diet plan and exercise program as recommended by healthcare provider.    Injection:    This medicine is given as a shot into a vein.    What do I do if I miss a dose? (does not   apply to patients in the hospital)     Take a missed dose as soon as possible.       If it is almost time for the next dose, skip the missed dose and return to your regular schedule.       Do not take a double dose or extra doses.       Do not change dose or stop medicine. Talk with healthcare provider.    What are the precautions when taking this medicine?     Wear disease medical alert identification.       If you are 65 or older, use this medicine with caution. You could have more side effects.       Use caution to prevent injury and avoid falls or accidents.       If you fall a lot, talk with healthcare provider.       If you have bleeding problems, talk with healthcare provider.       You may bleed more easily. Be careful. Avoid injury. Use soft toothbrush, electric razor.       If you have high blood pressure, talk with healthcare provider.        If you have kidney disease, talk with healthcare provider.       If you have liver disease, talk with healthcare provider.       If you have thyroid disease, talk with healthcare provider.       If you have had an ulcer or bleeding from your stomach or intestines, talk with healthcare provider.       If you have a weakened heart, talk with healthcare provider.       Do not donate blood while using this medicine and for 5 days after stopping.       Tell dentists, surgeons, and other healthcare providers that you use this medicine.       Check medicines with healthcare provider. This medicine may not mix well with other medicines.       Talk with healthcare provider before using other: aspirin, aspirin-containing products, blood thinners, garlic, ginseng, ginkgo, ibuprofen or like products, pain medicines, or vitamin E.       Talk with healthcare provider before taking multivitamins, natural products, and dietary supplements as these may have vitamin K in them.       Avoid alcohol (includes wine, beer, and liquor).       If you stop smoking, talk with healthcare provider. Amount of medicine you take may change.      Use birth control that you can trust to prevent pregnancy while taking this medicine.       Tell healthcare provider if you are breast-feeding.    What are some possible side effects of this medicine?     Bleeding problems.       Headache.      Nausea or vomiting. Small frequent meals, frequent mouth care, sucking hard, sugar-free candy, or chewing sugar-free gum may help.    What should I monitor?     Change in condition being treated. Is it better, worse, or about the same?       Signs or symptoms of bleeding.       Check blood work (prothrombin time/INR). Talk with healthcare provider.       Take good care of your teeth. See a dentist regularly.       Follow up with healthcare provider.    Reasons to call healthcare provider immediately       If you suspect an overdose, call  your local poison control center or emergency department immediately.       Signs of a life-threatening reaction. These include wheezing; chest tightness; fever; itching; bad cough; blue skin color; fits; or swelling of face, lips, tongue, or throat.       Severe dizziness or passing out.       Falls or accidents, especially if you hit your head. Talk with healthcare provider even if you feel fine.       Swelling or pain of leg or arm.       Significant change in thinking clearly and logically.       Severe headache.       Severe nausea or vomiting.       Severe back pain.       Severe belly pain.       Black, tarry, or bloody stools.       Blood in the urine.       Coughing up blood.       Vomiting blood.       Unusual bruising or bleeding.       Severe menstrual bleeding.       Change in skin color to black or purple.       Feeling extremely tired or weak.       Severe diarrhea.       An infection.       Any rash.       No improvement in condition or feeling worse.    How should I store this medicine?     Store at room temperature.       Protect from light.       Protect tablets from moisture. Do not store in a bathroom or kitchen.    Injection:    This medicine will be given to you in a healthcare setting. You will not store it at home.    General statements     If you have a life-threatening allergy, wear allergy identification at all times.       Do not share your medicine with others and do not take anyone else's medicine.       Keep all medicine out of the reach of children and pets.       Most medicines can be thrown away in household trash after mixing with coffee grounds or kitty litter and sealing in a plastic bag.       In [place] return any unused drugs back to the pharmacy. Also, visit http://www.hc-sc.gc.ca/hl-vs/iyh-vsv/med/disposal-defaire-eng.php#th for more facts about the right way to get rid of unused drugs.       Keep a list of all your medicines (prescription,  natural products, supplements, vitamins, over-the-counter) with you. Give this list to healthcare provider (doctor, nurse, nurse practitioner, pharmacist, physician assistant).       Call your doctor for medical advice about side effects. You may report side effects to FDA at 1-800-FDA-1088 or in [place] to [facility] [place]'s [place] Vigilance Program at 1-866-234-2345.       Talk with healthcare provider before starting any new medicine, including over-the-counter, natural products, or vitamins.    Please be aware that this information is provided to supplement the care provided by your physician. It is neither intended nor implied to be a substitute for professional medical advice. CALL YOUR HEALTHCARE PROVIDER IMMEDIATELY IF YOU THINK YOU MAY HAVE A MEDICAL EMERGENCY. Always seek the advice of your physician or other qualified   health provider prior to starting any new treatment or with any questions you may have regarding a medical condition.    Last modified: 10/05/2009

## 2011-05-12 NOTE — Progress Notes (Addendum)
Physical Therapy    Initial Assessment/treatment note    Date: 05/12/2011  Patient Name: Vernon Chen  MRN: [id number]    DOB: 1951/06/27    Treatment Diagnosis: L TKR    Restrictions  Restrictions/Precautions  Restrictions/Precautions: Fall Risk (high)  Position Activity Restriction  Other position/activity restrictions: up with assist  Vision/Hearing  Vision  Vision: Within Functional Limits  Hearing  Hearing: Within functional limits     Subjective  General  Chart Reviewed: Yes  Additional Pertinent Hx: GERD, HTN, heart murmur  Family / Caregiver Present: No  Referring Practitioner: Noyes  Diagnosis: L TKR 05/11/11  Follows Commands: Within Functional Limits  Subjective  Subjective: Pt states he travels  a lot for work and that he lives in [place] but will be in [place] for 2 weeks. He plans to d/c to hotel tomorrow.  Pain Screening  Patient Currently in Pain: Yes (rates knee pain 3/10, RN aware)      Orientation  Orientation  Overall Orientation Status: Within Normal Limits    Home Living  Home Living  Lives With: Spouse  Type of Home: House  Home Layout: Multi-level;Bed/Bath upstairs  Home Access: Level entry;Stairs to enter with rails (full flight to 2nd with railing)  Bathroom Shower/Tub: Pension scheme manager: Standard  Home Equipment: Crutches  Additional Comments: Pt lives in [place] and plans to stay in [place] until November 16    Objective     Prior Function  ADL Assistance: Independent  Homemaking Assistance: Independent  Ambulation Assistance: Independent  Transfer Assistance: Independent  PROM LLE (degrees)  LLE ROM:  (L knee 25-65)           Bed Mobility  Supine to Sit: Independent  Sit to Supine: Independent  Scooting: Independent    Transfers  Sit to Stand: Contact guard assistance  Stand to sit: Contact guard assistance    Ambulation  Ambulation?: No (pt became hypotensive (symptomatic) at EOB).  B/P 132/88, sp02 95%, HR 72 and RN aware.  Pt sat x 10 mins and had to  lay down due c/o cold, clammy, faint feeling.        Exercises  Comments: Pt independant with TKR HEP x10 reps L LE     Treatment included exercise and transfer training with patient education.     Assessment   Assessment  Assessment: Decreased functional mobility   Assessment: Pt with symptomatic orthostasis EOB this am.  Pt assisted back to bed with RN in room.  Will f/u later pm for gait assessment/training.  Treatment Diagnosis: L TKR  Discharge Recommendations: 24 hr. supervision assist (plans to do OP PT at MD office)  Requires PT Follow Up: Yes  Activity Tolerance  Activity Tolerance: Patient limited by fatigue (limited by orthostasis)  PT D/C Equipment  Equipment Needed: No (has own crutches)       Plan   Plan  Times per week: 6-7  Times per day: Twice a day  Current Treatment Recommendations: Functional Mobility Training  Patient Education: Pt educated on orthostatic hypotension, IS use, DVT prophylaxis, PT role and he verbalized understanding.l  Safety Devices  Safety Devices in place: Yes  Type of devices: Left in bed;Nurse notified;Bed alarm in place;Call light within reach    Goals  Short term goals  Time Frame for Short term goals: discharge  Short term goal 1: sit to/from stand SBA  Short term goal 2: L knee ROM 20-70       Annamary Carolin,  PT

## 2011-05-12 NOTE — Plan of Care (Signed)
Problem: Neurological  Intervention: PT Evaluation/treatment  Increase functional status to baseline

## 2011-05-12 NOTE — Progress Notes (Signed)
Physical Therapy  Daily Treatment Note    Date: 05/12/2011  Patient Name: Vernon Chen  MRN: [id number]     DOB: [birth year]    Restrictions  Restrictions/Precautions  Restrictions/Precautions: Fall Risk (high)  Position Activity Restriction  Other position/activity restrictions: up with assist  Subjective   General  Chart Reviewed: Yes  Additional Pertinent Hx: GERD, HTN, heart murmur  Family / Caregiver Present: Yes (wife here)  Referring Practitioner: Noyes  Subjective  Subjective: Pt states that he is no longer dizzy but still doesn't feel quite right.   Pain Screening  Patient Currently in Pain: Yes (rates knee pain 2/10, RN aware). RN reports femoral nerve block still on board.      Orientation  Orientation  Overall Orientation Status: Within Normal Limits  Objective   Bed Mobility  Supine to Sit: Independent  Sit to Supine: Independent  Scooting: Independent    Transfers  Sit to Stand: Contact guard assistance (x2)  Stand to sit: Contact guard assistance (x2 trials)    Ambulation  Device: Axillary Crutches  Assistance: Contact guard assistance  Quality of Gait: step to gait with max vc for sequence and heel toe pattern; pt impulsive and trying to go too fast;  WBAT L  Distance: 100 ft  Comments: Pt turned by spinning on R foot with one crutch off floor and able to correct with cues.  Stairs/Curb  Stairs?: No (did not attempt steps due to pt's difficulty with sequencing)        Exercises  Ankle Pumps: x10 reps B LE  Comments: seated L knee flexion to 80 degrees x 4 reps                  Assessment   Assessment  Assessment: Decreased functional mobility   Assessment: Pt with max difficulty focusing and following direction this pm. (wife concurs this is NOT baseline)  Pt impulsive and NOT safe to be up without assist today. Pt plans to d/c to hotel for 2 weeks at discharge. (tomorrow)  Treatment Diagnosis: L TKR  Discharge Recommendations: 24 hr. supervision assist (plans to do OP PT at MD office)  Requires PT  Follow Up: Yes  Timed Code Treatment Minutes: 43 Minutes  Total Treatment Time: 43   Activity Tolerance  Activity Tolerance: Patient Tolerated treatment well;Patient limited by fatigue  PT D/C Equipment  Equipment Needed: No (has own crutches)      Goals  Short term goals  Time Frame for Short term goals: discharge  Short term goal 1: sit to/from stand SBA  Short term goal 2: L knee ROM 20-70  Short term goal 3: up/down curb step with rail SBA  Short term goal 4: ambulate 150 ft with crutches SBA    Plan    Plan  Times per week: 6-7  Times per day: Twice a day  Current Treatment Recommendations: Functional Mobility Training  Patient Education: Pt educated on PT role, need to call for assist to get up (pt able to demo call light) d/c to hotel and he verbalized understanding.  Safety Devices  Safety Devices in place: Yes  Type of devices: Nurse notified;Left in chair;Call light within reach;Chair alarm in place     [place], [place]

## 2011-05-12 NOTE — Progress Notes (Signed)
Hospitalist Progress Note  05/12/2011 7:43 AM  Subjective:   Admit Date: 05/11/2011  PCP: No primary provider on file.    Overnight Events: none noted    CC: F/U for tachycardia  Interval History: pain is controlled. No ON issues. Has been voiding well. No cp, sob. Tolerating po w/o nausea.     Diet: General  Pain ZO:XWRU  Nausea:None  Bowel Movement/Flatus no    Data:   CBC: No results found for this basename: WBC:3,HGB:3,PLT:3 in the last 72 hours  BMP:  No results found for this basename: NA:3,K:3,CL:3,CO2:3,BUN:3,CREATININE:3,GLUCOSE:3 in the last 72 hours  Hepatic: No results found for this basename: AST:3,ALT:3,ALB:3,BILITOT:3,ALKPHOS:3 in the last 72 hours    Objective:   Vitals: BP 127/81   Pulse 77   Temp(Src) 97.5 ??F (36.4 ??C) (Oral)   Resp 16   Ht 5' 8.5" (1.74 m)   Wt 249 lb (112.946 kg)   BMI 37.31 kg/m2   SpO2 96%  General appearance: alert, appears stated age and cooperative  Skin: Skin color, texture, turgor normal. No rashes or lesions  HEENT: Head: Normal, normocephalic, atraumatic.  Eye: Normal external eye, conjunctiva, lids cornea, PERL.  Neck: supple, symmetrical, trachea midline and thyroid not enlarged, symmetric, no tenderness/mass/nodules  Lungs: clear to auscultation bilaterally  Heart: regular rate and rhythm, S1, S2 normal, no murmur, click, rub or gallop  Abdomen: soft, non-tender; bowel sounds normal; no masses,  no organomegaly  Extremities: extremities normal, atraumatic, no cyanosis or edema  Neurologic: Mental status: Alert, oriented, thought content appropriate    Assessment:   Active Problems:   * No active hospital problems. *     Plan:   1. OA s/p L TKA. Cont analgesics, pt/ot/oob today. Cont coumadin for dvt proph.  2. Tachycardia. Likely a stress response. Resolved today. Monitor.  3. HTN. BP well controlled on cozaar.  4. Dyslipidemia. On statin.   5. Ok to dc home once ready.     Full Code    Leonette Nutting M.D

## 2011-05-12 NOTE — Progress Notes (Signed)
POD#1 /sp left total knee replacement who is doing very well.  Pain controlled.  No problems overnight.  Ceasar Mons Vitals:    05/12/11 0700   BP: 127/81   Pulse: 77   Temp: 97.5 ??F (36.4 ??C)   Resp: 16         GEN: nad, alert, appropriate  LLE: quadriceps/df/pf/ehl/fhl 5/5, sensation intact to light touch, capillary refill less than 2 seconds.  Calf soft, non-tender.    A/P: POD#1 /sp left total knee replacement who is doing very well.  -OOB today with PT/OT, WBAT  -Coumadin 5mg  po tonight x1, scds on BLE  -plan for d/c home tomorrow

## 2011-05-12 NOTE — Progress Notes (Signed)
Occupational Therapy   Occupational Therapy Initial Assessment/Treatment/Discharge Note  Date: 05/12/2011   Patient Name: Vernon Chen  MRN: [id number]     DOB: [birth year]           Restrictions  Restrictions/Precautions  Restrictions/Precautions: Fall Risk (high)  Position Activity Restriction  Other position/activity restrictions: up with assist  Vision/Hearing  Vision  Vision: Within Functional Limits  Hearing  Hearing: Within functional limits  Subjective   General  Chart Reviewed: Yes  Additional Pertinent Hx: L knee OA; s/p L TKA 10/31. PMHx: GERD, HTN, heart murmur  Family / Caregiver Present: Yes (wife)  Subjective  Subjective: Pt agreeable with therapy.   Pain Screening  Patient Currently in Pain: Yes (rates knee pain 2/10, RN aware)    Home Living  Home Living  Lives With: Spouse  Type of Home: House  Home Layout: Multi-level;Bed/Bath upstairs  Home Access: Level entry;Stairs to enter with rails (full flight to 2nd with railing)  Bathroom Shower/Tub: Pension scheme manager: Standard  Home Equipment: Crutches  Additional Comments: Pt lives in [place] and plans to stay in [place] until November 16     Orientation  Orientation  Overall Orientation Status: Within Functional Limits  Objective   -- Treatment included: functional transfer training, ADL, pt education    IADL History  Homemaking Responsibilities: Yes (shares with wife but pt travels)  Theme park manager: Yes  Occupation: Full time employment  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   Prior Function  ADL Assistance: Independent  Homemaking Assistance: Independent  Ambulation Assistance: Independent  Transfer Assistance: Independent     Balance  Sitting Balance: Independent  Standing Balance: Contact guard assistance  Standing Balance  Time: about 10 min total  Activity: functional mobility, transfers, stance for grooming  Sit to stand: Stand by assistance (cues for crutch placement)  Stand to sit: Stand by assistance  (cues for crutch placement)  Comment: crutches; pt requires cues during ambulation as he is impulsive during turns (LOB - self correct) and transfers at this time.   Copywriter, advertising - Technique: Ambulating  Equipment Used: Raised toilet seat with rails  Transfer Level: Stand by assistance  Toilet Transfers Comments: cues for crutch placement -- will have raised at hotel, but standard at home - recommended to obtain raised toilet seat once home if needed - verb understanding.   ADL  Grooming: Independent  UE Bathing: Independent  UE Dressing: Independent  LE Dressing: Minimal assistance (cues and assist over feet)  Toileting: Independent  Transfer: Stand by assistance (cues for placement of crutches)  Tone RUE  RUE Tone: Normotonic  Tone LUE  LUE Tone: Normotonic  Coordination  Movements Are Fluid And Coordinated: Yes  Functional Activity Tolerance  Functional Activity Tolerance: Tolerates 30 min exercise with multiple rests  Bed mobility  Supine to Sit: Independent  Sit to Stand: Contact guard assistance (cues for crutch placement)  Transfers  Sit to stand: Stand by assistance (cues for crutch placement)  Stand to sit: Stand by assistance (cues for crutch placement)     Cognition  Overall Cognitive Status: WFL (impulsive)     ROM  ROM: WFL  LUE Strength  Gross LUE Strength: WFL  RUE Strength  Gross RUE Strength: WFL    Assessment   Assessment  Comments: Pt is safe to d/c with 24 hr assist initially from an OT standpoint. Pt has good set-up at hotel but may need equipment once home - recommended to  consult with therapists at home if needs arise.   Rehab Potential: Good  Recommendations: 24 hr. supervision/assist  Goal Formulation: Patient;Family  No Skilled OT: No acute OT goals identified;Safe to return home  Requires OT Follow Up: No  Timed Code Treatment Minutes: 30 Minutes  Total Treatment Time: 45   Response to Treatment  Response to Treatment: Patient Tolerated treatment well  Safety Devices  Safety  Devices in place: Yes  Type of devices: Left in chair;Nurse notified;Chair alarm in place;Call light within reach (family in room)  OT D/C Equipment  Equipment Needed: No  Other Comments  Comments: verb understanding of using call light for assist out of chair       Plan   --  D/C acute OT services. Pt seen for eval only and is safe to return home with 24 hr assist initially - no acute OT goals identified. No DME needed.   Plan  Patient Education: Role of OT, d/c planning, safety precautions - verb understanding      Lauren A Schummer, OTR/L, [id number]

## 2011-05-13 LAB — PROTIME-INR
INR: 1.2 — ABNORMAL HIGH (ref 0.9–1.1)
Protime: 15.2 seconds — ABNORMAL HIGH (ref 11.6–14.4)

## 2011-05-13 MED ADMIN — docusate sodium (COLACE) capsule 100 mg: ORAL | @ 13:00:00 | NDC 00904224461

## 2011-05-13 MED ADMIN — simvastatin (ZOCOR) tablet 40 mg: ORAL | @ 01:00:00 | NDC 68084051311

## 2011-05-13 MED ADMIN — dexamethasone (DECADRON) injection 6 mg: INTRAVENOUS | @ 04:00:00 | NDC 63323016505

## 2011-05-13 MED ADMIN — losartan (COZAAR) tablet 50 mg: ORAL | @ 13:00:00 | NDC 68084034711

## 2011-05-13 MED ADMIN — sodium chloride (PF) 0.9 % injection 10 mL: INTRAVENOUS | @ 01:00:00

## 2011-05-13 MED ADMIN — docusate sodium (COLACE) capsule 100 mg: ORAL | @ 01:00:00 | NDC 00904224461

## 2011-05-13 MED ADMIN — sodium chloride (PF) 0.9 % injection 10 mL: INTRAVENOUS | @ 13:00:00 | NDC 00409488810

## 2011-05-13 MED ADMIN — pantoprazole (PROTONIX) tablet 40 mg: ORAL | @ 11:00:00 | NDC 00008060704

## 2011-05-13 MED ADMIN — dexamethasone (DECADRON) injection 6 mg: INTRAVENOUS | @ 13:00:00 | NDC 63323016505

## 2011-05-13 MED ADMIN — HYDROcodone-acetaminophen (NORCO) 5-325 MG per tablet 2 tablet: 2 | ORAL | @ 08:00:00 | NDC 68084036811

## 2011-05-13 MED ADMIN — HYDROcodone-acetaminophen (NORCO) 5-325 MG per tablet 2 tablet: 2 | ORAL | @ 04:00:00 | NDC 68084036811

## 2011-05-13 MED ADMIN — HYDROcodone-acetaminophen (NORCO) 5-325 MG per tablet 2 tablet: 2 | ORAL | @ 13:00:00 | NDC 00406036562

## 2011-05-13 MED FILL — KCL IN DEXTROSE-NACL 20-5-0.45 MEQ/L-%-% IV SOLN: INTRAVENOUS | Qty: 1000

## 2011-05-13 MED FILL — HYDROCODONE-ACETAMINOPHEN 5-325 MG PO TABS: 5-325 mg | ORAL | Qty: 2

## 2011-05-13 MED FILL — SIMVASTATIN 40 MG PO TABS: 40 mg | ORAL | Qty: 1

## 2011-05-13 MED FILL — PROTONIX 40 MG PO TBEC: 40 mg | ORAL | Qty: 1

## 2011-05-13 MED FILL — DEXAMETHASONE SODIUM PHOSPHATE 4 MG/ML IJ SOLN: 4 mg/mL | INTRAMUSCULAR | Qty: 5

## 2011-05-13 MED FILL — LOSARTAN POTASSIUM 50 MG PO TABS: 50 MG | ORAL | Qty: 1

## 2011-05-13 MED FILL — DOCUSATE SODIUM 100 MG PO CAPS: 100 mg | ORAL | Qty: 1

## 2011-05-13 MED FILL — DOCUSATE SODIUM 100 MG PO CAPS: 100 MG | ORAL | Qty: 1

## 2011-05-13 NOTE — Progress Notes (Signed)
Hospitalist Progress Note  05/13/2011 8:18 AM  Subjective:   Admit Date: 05/11/2011  PCP: No primary provider on file.    Overnight Events: none noted    CC: F/U for htn  Interval History: feels well. Tells me he is being sent home today. Denies cp, sob, nausea. Tolerating po diet. Voiding well.     Diet: General  Pain ZO:XWRU  Nausea:None  Bowel Movement/Flatus yes    Data:   CBC:   Recent Labs   [facility] 05/12/11 0703    WBC 15.0*    HGB 13.1*    PLT 311     BMP:    Recent Labs   Basename 05/12/11 0703    NA 138    K 4.3    CL 102    CO2 24    BUN 13    CREATININE 0.68    GLUCOSE 132*     Hepatic: No results found for this basename: AST:3,ALT:3,ALB:3,BILITOT:3,ALKPHOS:3 in the last 72 hours    Objective:   Vitals: BP 116/74   Pulse 75   Temp(Src) 98.5 ??F (36.9 ??C) (Oral)   Resp 16   Ht 5' 8.5" (1.74 m)   Wt 249 lb (112.946 kg)   BMI 37.31 kg/m2   SpO2 96%  General appearance: alert, appears stated age and cooperative  Skin: Skin color, texture, turgor normal. No rashes or lesions  HEENT: Head: Normal, normocephalic, atraumatic.  Eye: Normal external eye, conjunctiva, lids cornea, PERL.  Neck: supple, symmetrical, trachea midline and thyroid not enlarged, symmetric, no tenderness/mass/nodules  Lungs: clear to auscultation bilaterally  Heart: regular rate and rhythm, S1, S2 normal, no murmur, click, rub or gallop  Abdomen: soft, non-tender; bowel sounds normal; no masses,  no organomegaly  Extremities: extremities normal, atraumatic, no cyanosis or edema  Neurologic: Mental status: Alert, oriented, thought content appropriate    Assessment:   Active Problems:   * No active hospital problems. *     Plan:   1. OA s/p L TKA. Cont analgesics, pt/ot/oob today. Cont coumadin for dvt proph.  2. Leucocytosis. Likely stress-related. No signs or sx of acute infection.   3. HTN. BP well controlled on cozaar.  4. Dyslipidemia. On statin.   5. Ok to [employer] home today.    Full  Code    Leonette Nutting M.D

## 2011-05-13 NOTE — Progress Notes (Signed)
Problem: Pain Control  Goal: Maintain pain level at or below patient???s acceptable level (or 5 if patient is unable to determine acceptable level)  Outcome: Ongoing  Pt rates pain 5/10, ambulating, gait is steady, C/o left knee pain. Denies numbness and tingle. Vicodin 2 tabs managed the pain. Call light within reach. Calls light appropriately. Neuro vascular check intact. Will cont to monitor.

## 2011-05-13 NOTE — Progress Notes (Signed)
PHYSICAL THERAPY DISCHARGE NOTE  Chart reviewed. Patient d/c'd from hospital. Patient d/c'd from acute PT. 0/4 goals met secondary to evaluation only performed. Please see patient's last progress note for last functional status report and d/c summary and recommendations.    Colon Flattery Nallely Yost, PT

## 2011-05-13 NOTE — Progress Notes (Signed)
POD#2 /sp left total knee replacement who is doing very well. Pain controlled. No problems overnight.   Ceasar Mons Vitals:    05/13/11 0650   BP: 116/74   Pulse: 75   Temp: 98.5 ??F (36.9 ??C)   Resp: 16       GEN: nad, alert, appropriate   LLE: quadriceps/df/pf/ehl/fhl 5/5, sensation intact to light touch, capillary refill less than 2 seconds. Calf soft, non-tender.   A/P: POD#2 /sp left total knee replacement who is doing very well.   -OOB today with PT/OT, WBAT   -Coumadin 5mg  po tonight x1, scds on BLE   -d/c home today, f/u today at 11am to start rehabilitiation

## 2011-05-13 NOTE — Progress Notes (Signed)
Pt agreeable to D/C.  VSS. Pain well controlled, N/V checks unremarkable, drsg C/D/I, IV removed. D/C instructions given, pt verbalized understanding. Pt to go to rehab from hospital. Pt D/C'd to wife

## 2011-05-13 NOTE — Consults (Signed)
Nutrition Consult Brief Note:  Pt requested information about weight loss; provided packet yesterday and reviewed this morning.  Pt has a plan for lifestyle change.  Marcy Salvo RD LD   Pager [phone]

## 2011-05-23 NOTE — Discharge Summary (Signed)
Was admitted to the hospital on 05/11/2011 and had a left total knee arthroplasty performed.  Procedure was well tolerated.  Was discharged from the hospital on 05/13/2011 in good condition.  Post-operative instructions are WBAT LLE, f/u on 05/13/2011 to initiate physical therapy.  Keep wound clean and dry. Will be taking coumadin for DVT prophylaxis.

## 2013-10-10 ENCOUNTER — Encounter

## 2013-10-10 NOTE — Progress Notes (Signed)
Chief Complaint    Knee Pain  Right knee    History of Present Illness:  [place] E [place] is a 63 y.o. male     Patient is a previous patient with a TKR on the left and is now in as a pre-op TKR for the right knee. Patient reports 45 min walking tolerance and complications with ADLs and quality of life decreasing. Pain 6/10. Patient does not have a history or family history of blood clots or diabetes. Patients states being in a pre-diabetic state, however, and has since altered diet.      Medical History:  Patient's medications, allergies, past medical, surgical, social and family histories were reviewed and updated as appropriate.    No Known Allergies  Current Outpatient Prescriptions on File Prior to Visit   Medication Sig Dispense Refill   ??? LOSARTAN POTASSIUM PO Take 50 mg by mouth daily.       ??? Multiple Vitamin (MULTIVITAMIN PO) Take  by mouth daily.         ??? SIMVASTATIN PO Take 40 mg by mouth daily.         No current facility-administered medications on file prior to visit.           Review of Systems:  Relevant review of systems reviewed and available in the patient's chart    Vital Signs:  Filed Vitals:    10/10/13 1009   BP: 145/103   Pulse: 77       Physical Exam   Constitutional:  oriented to person, place, and time,  appears well-developed and well-nourished.   Neurological:  is alert and oriented to person, place, and time. He has normal strength and normal reflexes.   Skin: No ecchymosis, no laceration, no lesion and no rash noted. No erythema.   Psychiatric:  has a normal mood and affect. His speech is normal. Cognition and memory are normal.     Knee Examination:    Inspection:  Mild swelling    Palpation:  Moderate crepitus    Range of Motion:  Normal    Strength:  Normal    Special Tests:  Normal    Skin: There are no rashes, ulcerations or lesions.          Radiology:     Previous X-rays reviewed in office:  Views: PA and merchant show advanced right knee OA  Impression Knee arthritis          Impression:  Right knee arthritis    Treatment Plan: Patient has been recommended for a right TKR.  This patient is needs a right TKR and a discussion and comprehensive presentation of thr RBA including providing extensive information and written material on the surgery, PO course and rehab and patient expectations and compliance. The risks explained included but not limited to infection, pain, swelling, woumd healing, blood clots, bleeding, fibrosis, anesthesia, allergies meds, atrophy, and limitation in knee motion, implant loosening, need for additional surgery, alignment problems.  Success rates reviewed including a normal knee cannot be established and there are limitations on activity that a TKR requires including 10% of patients in general having knee pain limits, weakness on chair on stair activity, and in particular that recreational activity may require major limits.  The final result cannot be predicted and each patient responds to surgery differently.   The patient verbally expressed an understanding and all questions answered. The patient has major arthritic damage to the knee joint with pain limiting daily activities and significant restrictions  and limitations effecting the quality of life.  All conservative measures have been completed and the patient wishes to undergo TKR.    Follow up: Once cleared for surgery for his pre-op appointment.      Armed forces technical officer and [employer]

## 2013-10-10 NOTE — Progress Notes (Signed)
Review of Systems   Constitutional: Negative.    HENT: Positive for hearing loss and sore throat.    Eyes: Negative.    Respiratory: Negative.    Cardiovascular: Positive for palpitations.        Hypertension   Gastrointestinal: Negative.    Genitourinary: Negative.    Musculoskeletal: Negative.    Skin: Negative.    Neurological: Negative.    Endo/Heme/Allergies: Negative.    Psychiatric/Behavioral: Negative.

## 2013-10-22 NOTE — Telephone Encounter (Signed)
[facility] at [phone] regarding full stand that had to be done 3 times and was still incorrect. He is going to notify billing so patient will only be charged for MRI and not the full stand films.

## 2014-01-03 NOTE — Progress Notes (Signed)
[facility] [address] [place], [place] [address]    Acknowledgment of Informed Consent for Surgical or Medical Procedure and Sedation  I agree to allow doctor(s) FRANK R NOYES and his/her associates or assistants, including residents and/or other qualified medical practitioner to perform the following medical treatment or procedure and to administer or direct the administration of sedation as necessary:  Procedure(s) : RIGHT TOTAL KNEE ARTHROPLASTY  My doctor has explained the following regarding the proposed procedure:  ??? the explanation of the procedure  ??? the benefits of the procedure  ??? the potential problems that might occur during recuperation  ??? the risks and side effects of the procedure which could include but are not limited to severe blood loss, infection, stroke or death  ??? the benefits, risks and side effect of alternative procedures including the consequences of declining this procedure or any alternative procedures  ??? the likelihood of achieving satisfactory results.  I acknowledge no guarantee or assurance has been made to me regarding the results.    I understand that during the course of this treatment/procedure, unforeseen conditions can occur which require an additional or different procedure.  I agree to allow my physician or assistants to perform such extension of the original procedure as they may find necessary.    I understand that sedation will often result in temporary impairment of memory and fine motor skills and that sedation can occasionally progress to a state of deep sedation or general anesthesia.    I understand the risks of anesthesia for surgery include, but are not limited to, sore throat, hoarseness, injury to face, mouth, or teeth; nausea; headache; injury to blood vessels or nerves; death, brain damage, or paralysis.    I understand that if I have a Limitation of Treatment order in effect during my hospitalization, the order may or may not be  in effect during this procedure.     I give my doctor permission to give me blood or blood products.  I understand that there are risks with receiving blood such as hepatitis, AIDS, fever, or allergic reaction.  I acknowledge that the risks, benefits, and alternatives of this treatment have been explained to me and that no express or implied warranty has been given by the hospital, any blood bank, or any person or entity as to the blood or blood components transfused.    At the discretion of my doctor, I agree to allow observers, equipment/product representatives and allow photographing, and/or televising of the procedure, provided my name or identity is maintained confidentially.      I agree the hospital may dispose of or use for scientific or educational purposes any tissue, fluid, or body parts which may be removed.    ________________________________Date________Time______ am/pm  (Circle One)  Patient or Signature of Closest Relative or Legal Guardian    ________________________________Date________Time______am/pm      Page 1 of ____  Witness

## 2014-01-14 NOTE — Telephone Encounter (Signed)
CPT 27447  DX  715.16   RT KNEE IP SX  01-29-14  APPROVED PER [employer] @ [employer]  [phone]   AUTH # [id number]  --LML

## 2014-01-15 NOTE — Progress Notes (Signed)
PT NOT COMING IN FOR PRE-ADMISSION  TESTING FAXED PAPERWORK TO INTERNIST HE HAD RECENT STRESS TEST WITH HIM  MAY HAVE TO DO LABS DOS

## 2014-01-15 NOTE — Patient Instructions (Signed)
[facility] PRE-SURGICAL TESTING INSTRUCTIONS  Date of Procedure 7/22Time of Procedure 1000  PRIOR TO PROCEDURE DATE:  1. Arrange for someone to take you home and be with you after discharge since you cannot drive after receiving sedation. Please ensure it is someone we can share information with regarding your discharge.    2. Contact your doctor for advice if:       a. You are taking any blood thinners, aspirin, anti-inflammatory or vitamin E products.       b. There is a change in your physical state such as a cold, fever, rash, cuts, sores or infection, especially near your surgical site.    3. Do not drink alcohol the day before your surgery.    4. Please follow guidelines prior to surgery for diet, medication, or preparations as advised by your doctor.    5. FOLLOW INSTRUCTIONS FOR ARRIVAL TIME AS DIRECTED BY YOUR DOCTOR.  If your doctor does not give you a specific arrival time, please arrive at 0800    THE DAY OF YOUR PROCEDURE:  1. On the day of surgery, please report to the registration desk at the MAIN entrance. . Follow the signs to valet parking to get to the MAIN entrance. Bring your photo ID and your insurance card. Please call [phone] if you have not pre registered yet.     2. DO NOT EAT OR DRINK ANYTHING AFTER MIDNIGHT. The only exception would be a small sip of water with any medications you were told to take the morning of surgery.        a. Medication instructions for the day of surgery:   LOSARTAN       b. Please contact your Diabetic Physician to receive instructions regarding your insulin for the night before and the day of surgery.    3. Do not swallow water when brushing teeth. No gum, candy, mints or ice chips. Refrain from smoking or at least decrease the amount.    4. Dress in loose, comfortable clothing appropriate for redressing after your procedure.  Do not wear jewelry, make-up-especially NO eye make-up, fingernail polish, lotion, powders or metal hairclips.    5. Dentures,  glasses, or contacts may need to be removed before surgery. Bring cases for your glasses, contacts, dentures, or hearing aids.  If you use a CPAP, please bring it with you the day of surgery.    6. Leave any valuables at home such as credit cards, cash, cell phones and jewelry. The hospital will not be responsible for valuables that are not secured in the hospital safe.    7. You may bring a bag with personal items if you are to spend the night. Please have any large items you may need brought in by your family after your arrival to your hospital room.    8. Please bring a copy of any history and physical form, or medical reports your doctor may have given you.    9. If you have a Living Will or Durable Power of Attorney, please bring a copy on the day of your procedure.     HOW WE KEEP YOU SAFE and WORK TO PREVENT SURGICAL SITE INFECTIONS:  1. Health care workers should always check your ID bracelet to verify your name and birth date. You will be asked many times to state your name, date of birth, and allergies.    2. Health care workers should always clean their hands with soap or alcohol gel before providing care  to you. It is okay to ask anyone if they cleaned their hands before they touch you.    3. You will be actively involved in verifying the type of surgery you are having and ensuring the correct surgical site is confirmed.    4. Do not shave near where you will have surgery. Shaving with a razor can irritate your skin and make it easier to develop an infection. On the day of your procedure, any hair that needs to be removed near the surgical site will be ???clipped??? by a Research scientist (physical sciences)healthcare worker using a special clippers designed to avoid skin irritation.    5. When you are in the operating room, your surgical site will be cleansed with a special soap and in most cases you will be given an antibiotic before the surgery begins.    AFTER YOUR PROCEDURE:  1. For comfort and safety, arrange to have someone at home with  you for the first 24 hours after discharge.    2. You and your family will be given written instructions about your diet, activity, dressing care, medications, and return visits.    3. Always clean your hands before and after caring for your wound.    4. Mild nausea, headache, muscle aches, sore throat, or fatigue may occur after anesthesia. Should any of these symptoms become severe, or should you notice any signs of infection, you should call your doctor.    5. Narcotic pain medications can cause significant constipation.  You may want to add a stool softener to your postoperative medication schedule or speak to your surgeon on how best to manage this side effect.    SPECIAL INSTRUCTIONS     ADDITIONAL INFORMATION REVIEWED:  Yes Bring a urine sample on day of surgery  Yes Taking Control of Your Pain  Yes FAQs about ???Surgical Site Infections  Yes Hibiclens?? Bathing Instructions or Other Antibacterial Soap (like Dial) HE WILL CHECK WITH DONNA  No Other   Thank you for allowing us to care for you.  We strive to exceed your expectations in the delivery of care and service to you and your family.    For further instructions or questions, please call [phone]    Information reviewed with patient during phone interview.   01/15/2014 .10:46 AM .Farrel GobbleJanet Jaxiel Chen

## 2014-01-15 NOTE — Patient Instructions (Signed)
The following educational items and goals will be achieved upon completion of the patient's Pre-admission testing appointment:             Identify the learner who is being assessed for education:  Patient                       Ability to Learn:  Exhibits ability to grasp concepts and respond to questions: High  Ready to Learn: Yes CALM  Preferred Method of Learning:  verbal  Barriers to Learning: Verbalizes interest  Special Considerations due to cultural, religious, spiritual beliefs:  No  Language:  English  Language Interpreter:  No    [facility] Per[facility] Plan Goals  [x]  Appropriate evaluation / integration of data as delineated by ASPAN Standards of Perianesthesia Nursing Practice    Pain scale and pain management   [x] Patient verbalizes understanding of pain scale and pain management  [x] Pre-operative determination of patient???s anticipated Post-Operative pain goal:   4 of 10 on 10 point scale post op goal  []  Other     Medication(s) - Compliance with preop medication instructions  [x]  Patient verbalizes understanding of preop medications (see [facility] Presurgical Instructions)    Instructions, Pre op                                                                                            [x]  Patient verbalizes understanding of presurgical instructions as reviewed with phone interview nurse or in-person nurse review    Fall Risk Potential, Preoperatively                                                                                   [] No preoperative risk identified  [x] Preop risk identified:                    [] Sensory deficit        [x] Motor deficit        [x] Balance problem        [x] Home medication        [] Uses assistive device                    [] History of a Fall within the last 30 days    Goal(s) for fall prevention:  [x] Prevent fall or injury by requesting assistance with activities of daily living  [x] Patient / Significant other verbalizes understanding the need to call for  assistance prior to getting out of bed during hospitalization      Infection Precautions                                                                                            [  x] Patient understands implementation of Surgical Site Infection precautions (see [facility]ructions)     Patient Safety  [x]  Patient identification verified  [x]  Site verified    Instructions - Discharge Planning for Outpatients  [x]  Patient / significant other voices understanding of home care and follow up procedures  [x]  Encourage patient / significant other to review discharge instructions the day after procedure due to sedation on day of surgery    Anticipated Special Needs upon discharge:        [x]  Cooling device        []  Crutches       [x]  Walker        [x]  Wound Support device        []  Drain        []  Other       Instructions - Discharge Planning for Admitted patients  []  Patient / significant other understands plan for admission after surgery  []  Patient / significant other understands plan for anticipated discharge dispostion        01/15/2014 10:44 AM Farrel GobbleJanet Henna Derderian

## 2014-01-28 NOTE — Progress Notes (Signed)
Chief Complaint    Pre-op Exam  Right Knee    History of Present Illness:  Vernon Chen is a 63 y.o. male     The patient is being seen in the clinic today for their pre-op examination. The patient is in relatively good health. The patient has no history of blood clots, patient doesn't have organ dysfunction but has been diagnosed with partial right bundle branch block and is on blood pressure medication for this. The is not diabetic, no known allergies to medications, tolerates pain medications. The patients continues to have sever difficulties with ADLs an ever diminishing quality of life. Patient had his left knee replaced in 2012 and has been very happy with the replacement.    Symptoms:  Onset/Duration:   ADLs:  Activity Levels:    Medical History:  Patient's medications, allergies, past medical, surgical, social and family histories were reviewed and updated as appropriate.    No Known Allergies  Current Outpatient Prescriptions on File Prior to Visit   Medication Sig Dispense Refill   ??? vitamin B-12 (CYANOCOBALAMIN) 1000 MCG tablet Take 1,000 mcg by mouth daily       ??? polycarbophil (FIBERCON) 625 MG tablet Take 625 mg by mouth daily TAKES TWO A DAY       ??? vitamin D 1000 UNITS CAPS Take by mouth       ??? sodium chloride (OCEAN, BABY AYR) 0.65 % nasal spray 1 spray by Nasal route as needed for Congestion       ??? omeprazole (PRILOSEC) 20 MG capsule Take 20 mg by mouth daily       ??? fexofenadine (ALLEGRA ALLERGY) 180 MG TABS Take by mouth       ??? aspirin 81 MG tablet Take 81 mg by mouth daily.       ??? LOSARTAN POTASSIUM PO Take 50 mg by mouth daily.       ??? Multiple Vitamin (MULTIVITAMIN PO) Take  by mouth daily.         ??? SIMVASTATIN PO Take 40 mg by mouth daily.         No current facility-administered medications on file prior to visit.           Review of Systems:  Relevant review of systems reviewed and available in the patient's chart    Vital Signs:  Filed Vitals:    01/28/14 1431   BP: 141/77   Pulse:  69         Physical Exam ______  Constitutional:  oriented to person, place, and time,  appears well-developed and well-nourished. ________________________________  Neurological:  is alert and oriented to person, place, and time. He has normal strength and normal reflexes.   Skin: No ecchymosis, no laceration, no lesion and no rash noted. No erythema. ____  Psychiatric:  has a normal mood and affect. His speech is normal. Cognition and memory are normal. __    Knee Examination:    Inspection:  Normal    Skin: There are no rashes, ulcerations or lesions.        Radiology:     X-rays  reviewed in office:  Views PA View Right knee shows sever joint narrowing TF compartments         Impression:  Encounter Diagnosis   Name Primary?   ??? Right knee DJD Yes       Treatment Plan:  The patient is scheduled for right TKR and a discussion and comprehensive presentation of thr RBA including  providing extensive information and written material on the surgery, PO course and rehab and patient expectations and compliance.  The Web site patient instructional series was reviewed for further info for the patient and family.  The PO instruction sheet is provided and details on the DVT program and skin preparation pre op explained. The risks explained included but not limited to infection, pain, swelling, woumd healing, blood clots, bleeding, fibrosis, anesthesia, allergies meds, atrophy, and limitation in knee motion, implant loosening, need for additional surgery, alignment problems.  Success rates reviewed including a normal knee cannot be established and there are limitations on activity that a TKR requires including 10% of patients in general having knee pain limits, weakness on chair on stair activity, and in particular that recreational activity may require major limits.  The final result cannot be predicted and each patient responds to surgery differently.   The patient verbally expressed an understanding and all questions  answered. The patient has major arthritic damage to the knee joint with pain limiting daily activities and significant restrictions and limitations effecting the quality of life.  All conservative measures have been completed and the patient wishes to undergo TKR.    Follow up: Post-Op    Board Certified Orthopaedic Surgeon  President and Medical Director  [employer]

## 2014-01-28 NOTE — Other (Signed)
Physical Therapy Note  Date:  01/28/2014    Patient Name:  Vernon Chen    DOB:  11-12-1950  MRN: [id number]  Restrictions/Precautions: Lafe GarinBethesda, MD OOT Pt - [place] 2 [place] (daily appointments)   Medical/Treatment Diagnosis Information:   ?? 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  Meds:  Insurance/Certification information:   [employer]  Physician Information:  Referring Practitioner: Darlys GalesFrank Noyes  Plan of care signed (Y/N): pending  Visit# / total visits:  1  Pain level: 0/10 at rest; 8/10 at worst - when exercising and pushing through pain       G-Code (if applicable):      Date / Visit # G-Code Applied: 01/28/14 / Visit #1  PT G-Codes  Functional Assessment Tool Used: LEFS  Score: 33/80 = 41%  Functional Limitation: Mobility: Walking and moving around  Mobility: Walking and Moving Around Current Status ([id number](G8978): At least 40 percent but less than 60 percent impaired, limited or restricted  Mobility: Walking and Moving Around Goal Status [phone](G8979): 0 percent impaired, limited or restricted    Next Progress Note due by: Visit #10           Restrictions:       Subjective: See eval    Objective:    Functional Scale:  LEFS 33/80 = 41% > 59% impaired (pre-op 01/28/14)    Observation:  ??? Quad Recruitment: good    ??? Effusion: no visible swelling    ??? Wound Appearance: NA    ??? Palpation: TTP joint lines    ??? Gait: normal    ??? Other:     01/28/14  ROM PROM AROM Overpressure Comment    L R L R L R    Flexion 120 115        Extension 0 -2                              01/28/14  Strength L R Comment   Quad 5/5 5/5    Hamstring 5/5 5/5    Gastroc 5/5 5/5    Hip flexors 5/5 5/5    Hip abduction 5/5 5/5      01/28/14  Special Test Results/Comment   Homans NPV   Temp NPV     01/28/14  Girth L R   Mid Patella 46.0 46.2   Suprapatellar 47.8 47.9   5cm above 49.5 50.5   15cm above       Exercises:  Exercise/Equipment Resistance/Repetitions Other comments   Stretching     Hamstring     Towel Pull     Inclined Calf     Hip Flexion      ITB     Groin     Quad          SLR     Supine     Abduction     Adduction     Prone     SLR+          Isometrics     Quad sets          Patellar Glides     Medial     Superior     Inferior          ROM     Sheet Pulls     Hang Weights     Passive     Active     Weight Shift  Ankle Pumps          CKC     Calf raises     Wall sits     Step ups     1 leg stand     Squatting     CC TKE     Balance          PRE     Extension  RANGE:   Flexion  RANGE:   Leg Press  RANGE:        Other                        Other Therapeutic Activities:  Fit and distributed crutches and TEDs  Gait training for WBAT gait with crutches    Home Exercise Program:   Initial PO exercises. Written and verbal instructions were provided to patient.    Patient Education:      Reviewed full pre-op instructions    Manual Treatments:     Modalities:    []  hot packs  []  EMS   []  Ultrasound  []  ice   []  vasopneumatic         []  high volt/EGS  []  phono  []  tens    []  ionto  []  autorange/biodex []  Interferential  []  other    Timed Code Treatment Minutes: 64'    Total Treatment Minutes: 44'    Assessment:    [x]  Patient tolerated treatment well []  Patient limited by fatigue  []  Patient limited by pain  []  Patient limited by other medical complications  []  Other:      Plan:       ?    []  Continue per plan of care []  Alter current plan (see comments)  [x]  Plan of care initiated []  Hold pending MD visit []  Discharge    Rehab Potential: []  Excellent [x]  Good []  Fair  []  Poor    Frequency/Duration:  # Days per week: [x]  1 day # Weeks: []  1 week []  5 weeks     [x]  2 days?   []  2 weeks []  6 weeks     []  3 days   []  3 weeks []  7 weeks     []  4 days   []  4 weeks [x]  8 weeks      Plan for next session: PO visit    MD follow up: 1/09/14/10 weeks PO    Goals  Short term goals  Time Frame for Short term goals: 1 week  Short term goal 1: Pt will be independent with HEP  Short term goal 2: Pt will have proper PWB gait mechanics with B/L crutches  Short term goal 3: Pt  will demonstrate understanding of PO instructions  Long term goals  Time Frame for Long term goals : 4-6 weeks  Long term goal 1: Pt will have 0-115 degrees of knee PROM  Long term goal 2: Pt will be able to ambulate with proper FWB mechanics without AD  Long term goal 3: Pt will have at least 9% improvement from post-op LEFS       Electronically Signed By:  Reggie Pile, PT

## 2014-01-28 NOTE — Progress Notes (Signed)
Physical Therapy Note  Date:  02-Feb-2014    Patient Name:  Vernon Chen    DOB:  03/15/1951  MRN: [id number]  Restrictions/Precautions: Lafe Garin, MD OOT Pt - [place] 2 [place] (daily appointments)   Medical/Treatment Diagnosis Information:   ?? 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  Meds:  Insurance/Certification information:   [employer]  Physician Information:  Referring Practitioner: Darlys Gales  Plan of care signed (Y/N): pending  Visit# / total visits:  1  Pain level: 0/10 at rest; 8/10 at worst - when exercising and pushing through pain        General  Chart Reviewed: Yes  Patient assessed for rehabilitation services?: Yes  Referring Practitioner: Darlys Gales  Referral Date : 02/02/14  Diagnosis: R Knee OA // TKR         G-Code (if applicable):      Date / Visit # G-Code Applied: 2014/02/02 / Visit #1  PT G-Codes  Functional Assessment Tool Used: LEFS  Score: 33/80 = 41%  Functional Limitation: Mobility: Walking and moving around  Mobility: Walking and Moving Around Current Status (Q4696): At least 40 percent but less than 60 percent impaired, limited or restricted  Mobility: Walking and Moving Around Goal Status [id number]): 0 percent impaired, limited or restricted    Next Progress Note due by: Visit #10           Restrictions:       Subjective:   Mechanism of injury: Progressive worsening of OA symptoms, last 6 months has begun to be limited with normal activities.     Date of injury: ~3 years ago pain began    Current complaints: pain, clicking, swelling    CLOF: Painful with stairs, squatting, walking. Unable to jump rope, stay active, walking for >4-5 blocks, standing >2hrs.     PLOF: Unrestricted about 3 years ago    Patients rehabilitation goal: Would like to be unrestricted in working out and staying active, return to all normal activities without restricted.    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 Travels a lot. Enjoys  staying active, biking, lifting weights, etc. Has disabled son, must care for him. Also has grandkids.    Pertinent Past medical history: L TKR    Pertinent medications to this condition: Ibuprofen PRN      Objective:    Functional Scale:  LEFS 33/80 = 41% > 59% impaired (pre-op February 02, 2014)    Observation:  ??? Quad Recruitment: good    ??? Effusion: no visible swelling    ??? Wound Appearance: NA    ??? Palpation: TTP joint lines    ??? Gait: normal    ??? Other:     2014-02-02  ROM PROM AROM Overpressure Comment    L R L R L R    Flexion 120 115        Extension 0 -2                              Feb 02, 2014  Strength L R Comment   Quad 5/5 5/5    Hamstring 5/5 5/5    Gastroc 5/5 5/5    Hip flexors 5/5 5/5    Hip abduction 5/5 5/5      2014/02/02  Special Test Results/Comment   Homans NPV   Temp NPV     2014-02-02  Girth  L R   Mid Patella 46.0 46.2   Suprapatellar 47.8 47.9   5cm above 49.5 50.5   15cm above       Exercises:  Exercise/Equipment Resistance/Repetitions Other comments   Stretching     Hamstring     Towel Pull     Inclined Calf     Hip Flexion     ITB     Groin     Quad          SLR     Supine     Abduction     Adduction     Prone     SLR+          Isometrics     Quad sets          Patellar Glides     Medial     Superior     Inferior          ROM     Sheet Pulls     Hang Weights     Passive     Active     Weight Shift     Ankle Pumps          CKC     Calf raises     Wall sits     Step ups     1 leg stand     Squatting     CC TKE     Balance          PRE     Extension  RANGE:   Flexion  RANGE:   Leg Press  RANGE:        Other                        Other Therapeutic Activities:  Fit and distributed crutches and TEDs  Gait training for WBAT gait with crutches    Home Exercise Program:   Initial PO exercises. Written and verbal instructions were provided to patient.    Patient Education:      Reviewed full pre-op instructions    Manual Treatments:     Modalities:    []  hot packs  []  EMS   []  Ultrasound  []  ice   []  vasopneumatic          []  high volt/EGS  []  phono  []  tens    []  ionto  []  autorange/biodex []  Interferential  []  other    Timed Code Treatment Minutes: 2650'    Total Treatment Minutes: 350'    Assessment:  Assessment: Pt presents pre-operatively for TKR. Currently has normal strength and girth measurements with mildly limited fleixon and extension ROM. Expectations, POC and diagnosis was reviewed with patient.  Patient reports understanding of expectations, POC and diagnosis.    [x]  Patient tolerated treatment well []  Patient limited by fatigue  []  Patient limited by pain  []  Patient limited by other medical complications  []  Other:      Plan:       ?    []  Continue per plan of care []  Alter current plan (see comments)  [x]  Plan of care initiated []  Hold pending MD visit []  Discharge    Rehab Potential: []  Excellent [x]  Good []  Fair  []  Poor    Frequency/Duration:  # Days per week: [x]  1 day # Weeks: []  1 week []  5 weeks     [x]  2 days?   []  2 weeks []  6 weeks     []   3 days   []  3 weeks []  7 weeks     []  4 days   []  4 weeks [x]  8 weeks  Interventions:  [x]  Therapeutic Exercise    [x]  Modalities:  [x]  Therapeutic Activity     []  Ultrasound  [x]  Electrical Stimulation  [x]  Gait Training      []  Cervical Traction []  Lumbar Traction  [x]  Neuromuscular Re-education    [x]  Cold/hotpack []  Iontophoresis   [x]  Instruction in HEP     Other:  [x]  Manual Therapy      []                Plan for next session: PO visit    MD follow up: 1/09/14/10 weeks PO    Goals  Short term goals  Time Frame for Short term goals: 1 week  Short term goal 1: Pt will be independent with HEP  Short term goal 2: Pt will have proper PWB gait mechanics with B/L crutches  Short term goal 3: Pt will demonstrate understanding of PO instructions  Long term goals  Time Frame for Long term goals : 4-6 weeks  Long term goal 1: Pt will have 0-115 degrees of knee PROM  Long term goal 2: Pt will be able to ambulate with proper FWB mechanics without AD  Long term goal 3: Pt will have  at least 9% improvement from post-op LEFS       Electronically Signed By:  Reggie Pile, PT

## 2014-01-29 ENCOUNTER — Inpatient Hospital Stay
Admit: 2014-01-29 | Disposition: A | Discharge status: Home / Self Care | Source: Ambulatory Visit | Attending: Sports Medicine | Admitting: Sports Medicine

## 2014-01-29 DIAGNOSIS — M1990 Unspecified osteoarthritis, unspecified site: Secondary | ICD-10-CM

## 2014-01-29 LAB — COMPREHENSIVE METABOLIC PANEL
ALT: 34 U/L (ref 10–40)
AST: 28 U/L (ref 15–37)
Albumin/Globulin Ratio: 2.1 (ref 1.1–2.2)
Albumin: 4.8 g/dL (ref 3.4–5.0)
Alkaline Phosphatase: 49 U/L (ref 40–129)
Anion Gap: 8 (ref 3–16)
BUN: 21 mg/dL — ABNORMAL HIGH (ref 7–20)
CO2: 31 mmol/L (ref 21–32)
Calcium: 9.8 mg/dL (ref 8.3–10.6)
Chloride: 101 mmol/L (ref 99–110)
Creatinine: 0.8 mg/dL (ref 0.8–1.3)
GFR African American: >60 (ref >60)
GFR Non-African American: >60 (ref >60)
Globulin: 2.3 g/dL
Glucose: 99 mg/dL (ref 70–99)
Potassium: 4.6 mmol/L (ref 3.5–5.1)
Sodium: 140 mmol/L (ref 136–145)
Total Bilirubin: 0.6 mg/dL (ref 0.0–1.0)
Total Protein: 7.1 g/dL (ref 6.4–8.2)

## 2014-01-29 LAB — PROTIME-INR
INR: 1.01 (ref 0.85–1.16)
INR: 1.05 (ref 0.85–1.16)
Protime: 10.9 s (ref 9.1–12.6)
Protime: 11.4 s (ref 9.1–12.6)

## 2014-01-29 MED ORDER — LABETALOL HCL 5 MG/ML IV SOLN
5 | INTRAVENOUS | Status: DC | PRN
Start: 2014-01-29 — End: 2014-01-29
  Administered 2014-01-29 (×2): 5 mg via INTRAVENOUS

## 2014-01-29 MED ORDER — ACETAMINOPHEN 325 MG PO TABS
325 | ORAL | Status: DC | PRN
Start: 2014-01-29 — End: 2014-01-31

## 2014-01-29 MED ORDER — FENTANYL CITRATE 0.05 MG/ML IJ SOLN
0.05 | INTRAMUSCULAR | Status: DC | PRN
Start: 2014-01-29 — End: 2014-01-29

## 2014-01-29 MED ORDER — OXYCODONE-ACETAMINOPHEN 5-325 MG PO TABS
5-325 | ORAL | Status: DC | PRN
Start: 2014-01-29 — End: 2014-01-31
  Administered 2014-01-30: 01:00:00 1 via ORAL

## 2014-01-29 MED ORDER — ONDANSETRON HCL 4 MG/2ML IJ SOLN
4 | Freq: Once | INTRAMUSCULAR | Status: DC | PRN
Start: 2014-01-29 — End: 2014-01-29

## 2014-01-29 MED ORDER — MORPHINE SULFATE (PF) 2 MG/ML IV SOLN
2 | INTRAVENOUS | Status: DC | PRN
Start: 2014-01-29 — End: 2014-01-31

## 2014-01-29 MED ADMIN — HYDROmorphone HCl PF (DILAUDID) injection 0.5 mg: 0.5 mg | INTRAVENOUS | @ 16:00:00 | NDC 00409128331

## 2014-01-29 MED ADMIN — ondansetron (ZOFRAN) injection 4 mg: 4 mg | INTRAVENOUS | @ 12:00:00 | NDC 00641607801

## 2014-01-29 MED ADMIN — ceFAZolin (ANCEF) 2 g in dextrose 5% 100 mL IVPB: 2 g | INTRAVENOUS | @ 21:00:00 | NDC 09999990046

## 2014-01-29 MED ADMIN — lactated ringers infusion: INTRAVENOUS | @ 11:00:00 | NDC 00338011704

## 2014-01-29 MED ADMIN — 0.9 % sodium chloride infusion: INTRAVENOUS | @ 20:00:00 | NDC 00338004904

## 2014-01-29 MED ADMIN — polycarbophil (FIBERCON) tablet 625 mg: 625 mg | ORAL | @ 20:00:00 | NDC 24385012576

## 2014-01-29 MED ADMIN — aspirin EC tablet 81 mg: 81 mg | ORAL | @ 21:00:00 | NDC 63739052210

## 2014-01-29 MED ADMIN — LORazepam (ATIVAN) 2 MG/ML injection: 0.5 | INTRAVENOUS | @ 16:00:00 | NDC 00641604401

## 2014-01-29 MED ADMIN — docusate sodium (COLACE) capsule 100 mg: 100 mg | ORAL | @ 20:00:00 | NDC 62584068311

## 2014-01-29 MED ADMIN — vitamin B-12 (CYANOCOBALAMIN) tablet 1,000 mcg: 1000 ug | ORAL | @ 20:00:00 | NDC 00904421713

## 2014-01-29 MED ADMIN — Dexamethasone Sodium Phosphate injection 10 mg: 10 mg | INTRAVENOUS | @ 20:00:00 | NDC 63323016505

## 2014-01-29 MED ADMIN — oxyCODONE-acetaminophen (PERCOCET) 5-325 MG per tablet 2 tablet: 2 | ORAL | @ 21:00:00 | NDC 00406051223

## 2014-01-29 MED ADMIN — ceFAZolin (ANCEF) 2 g in dextrose 5% 100 mL IVPB: 2 g | INTRAVENOUS | @ 13:00:00 | NDC 09999990046

## 2014-01-29 MED ADMIN — warfarin (COUMADIN) tablet 5 mg: 5 mg | ORAL | @ 22:00:00 | NDC 00056017201

## 2014-01-29 MED FILL — COUMADIN 5 MG PO TABS: 5 MG | ORAL | Qty: 1

## 2014-01-29 MED FILL — LIDOCAINE HCL (CARDIAC) 20 MG/ML IV SOLN: 20 MG/ML | INTRAVENOUS | Qty: 5

## 2014-01-29 MED FILL — ASPIRIN EC 81 MG PO TBEC: 81 MG | ORAL | Qty: 1

## 2014-01-29 MED FILL — ZEMURON 50 MG/5ML IV SOLN: 50 MG/5ML | INTRAVENOUS | Qty: 10

## 2014-01-29 MED FILL — HYDROMORPHONE HCL PF 2 MG/ML IJ SOLN: 2 MG/ML | INTRAMUSCULAR | Qty: 1

## 2014-01-29 MED FILL — CYKLOKAPRON 100 MG/ML IV SOLN: 100 MG/ML | INTRAVENOUS | Qty: 10

## 2014-01-29 MED FILL — NEOSTIGMINE METHYLSULFATE 1 MG/ML IJ SOLN: 1 MG/ML | INTRAMUSCULAR | Qty: 10

## 2014-01-29 MED FILL — ONDANSETRON HCL 4 MG/2ML IJ SOLN: 4 MG/2ML | INTRAMUSCULAR | Qty: 2

## 2014-01-29 MED FILL — PROPOFOL 10 MG/ML IV EMUL: 10 MG/ML | INTRAVENOUS | Qty: 40

## 2014-01-29 MED FILL — MORPHINE SULFATE 10 MG/ML IJ SOLN: 10 MG/ML | INTRAMUSCULAR | Qty: 0.5

## 2014-01-29 MED FILL — ANECTINE 20 MG/ML IJ SOLN: 20 MG/ML | INTRAMUSCULAR | Qty: 10

## 2014-01-29 MED FILL — CEFAZOLIN 2000 MG IN D5W 100 ML IVPB: Qty: 2

## 2014-01-29 MED FILL — VITAMIN B-12 1000 MCG PO TABS: 1000 MCG | ORAL | Qty: 1

## 2014-01-29 MED FILL — FIBERTAB 625 MG PO TABS: 625 MG | ORAL | Qty: 1

## 2014-01-29 MED FILL — CEFAZOLIN 2000 MG IN D5W 100 ML IVPB: Qty: 2 | Fill #0

## 2014-01-29 MED FILL — PERCOCET 5-325 MG PO TABS: 5-325 MG | ORAL | Qty: 2

## 2014-01-29 MED FILL — PHENYLEPHRINE HCL 10 MG/ML IJ SOLN: 10 MG/ML | INTRAMUSCULAR | Qty: 1

## 2014-01-29 MED FILL — DEXAMETHASONE SODIUM PHOSPHATE 20 MG/5ML IJ SOLN: 20 MG/5ML | INTRAMUSCULAR | Qty: 5

## 2014-01-29 MED FILL — GLYCOPYRROLATE 0.4 MG/2ML IJ SOLN: 0.4 MG/2ML | INTRAMUSCULAR | Qty: 4

## 2014-01-29 MED FILL — DOCUSATE SODIUM 100 MG PO CAPS: 100 MG | ORAL | Qty: 1

## 2014-01-29 MED FILL — GENTAMICIN SULFATE 40 MG/ML IJ SOLN: 40 MG/ML | INTRAMUSCULAR | Qty: 6

## 2014-01-29 MED FILL — HYDROMORPHONE HCL PF 1 MG/ML IJ SOLN: 1 MG/ML | INTRAMUSCULAR | Qty: 1

## 2014-01-29 MED FILL — LORAZEPAM 2 MG/ML IJ SOLN: 2 MG/ML | INTRAMUSCULAR | Qty: 1

## 2014-01-29 MED FILL — LABETALOL HCL 5 MG/ML IV SOLN: 5 MG/ML | INTRAVENOUS | Qty: 4

## 2014-01-29 NOTE — Anesthesia Post-Procedure Evaluation (Signed)
Anesthesia Post-op Note    Patient: Vernon Chen    Procedure(s) Performed:Right total knee    Anesthesia type: General      Post-op assessment:  Anesthetic Problems: no   Last Vitals:   Filed Vitals:    01/29/14 0647   BP: 144/87   Pulse: 71   Temp: 97.7 ??F (36.5 ??C)   Resp: 14     Cardiovascular System Stable: yes  Respiratory Function: Airway Patent yes  ETT no  Ventilator no  Level of consciousness: awake  Post-op pain: Adequate analgesia  Hydration adequate: yes  Nausea/Vomitting: no  Blood pressure 145/80, pulse 92, temperature 98.4 ??F (36.9 ??C), temperature source Oral, resp. rate 18, height 5\' 10"  (1.778 m), weight 245 lb (111.131 kg), SpO2 93 %.      Vernon Chen  9:28 AM

## 2014-01-29 NOTE — Anesthesia Pre-Procedure Evaluation (Signed)
Vernon Chen     Anesthesia Evaluation     Patient summary reviewed and Nursing notes reviewed    No history of anesthetic complications   Airway   Mallampati: II  Dental - normal exam     Pulmonary - negative ROS   Cardiovascular   (+) hypertension (took med this am),     Neuro/Psych - negative ROS   GI/Hepatic/Renal    (+) GERD (took prilosec today),     Endo/Other  (+) , arthritis  Abdominal        Other findings: 5'10''    245 lbs    Vernon Chen, Vernon Chen <[id number]> - 63 y.o. Male      Preoperative Vitals     BP: 144/87 Pulse: 71 Resp: 14   SpO2: 97 Temp: 97.7 ??F (36.5 ??C)    Ht: 5\' 10"  (1.778 m) (01/29/14) Wt: 245 lb (111.131 kg) (01/29/14)   BMI: 35.2 IBW: 160 lb 15 oz (73.001 kg)   Last edited 01/29/14 0647 by NG         Allergies     No Known Allergies      Facility Administered Medications     ceFAZolin (ANCEF) 2 g in dextrose 5% 100 mL IVPB    ortho mix (with morphine) injection       tranexamic acid irrigation 1,000 mg    lactated ringers infusion       lactated ringers infusion    sodium chloride flush 0.9 % injection 10 mL       sodium chloride flush 0.9 % injection 10 mL    ondansetron (ZOFRAN) injection 4 mg         Prescription Medications        Last Taken Last Updated    methylphenidate (RITALIN) 20 MG tablet    01/28/2014 01/29/14 0651    vitamin B-12 (CYANOCOBALAMIN) 1000 MCG tablet    Taking 01/28/14 1429    polycarbophil (FIBERCON) 625 MG tablet    Taking 01/28/14 1429    vitamin D 1000 UNITS CAPS    Taking 01/28/14 1429    sodium chloride (OCEAN, BABY AYR) 0.65 % nasal spray    Taking 01/28/14 1429    omeprazole (PRILOSEC) 20 MG capsule    01/29/2014 01/29/14 0651    fexofenadine (ALLEGRA ALLERGY) 180 MG TABS    01/29/2014 01/29/14 0651    aspirin 81 MG tablet    01/09/2014 01/29/14 0651    LOSARTAN POTASSIUM PO    01/29/2014 01/29/14 0651    Multiple Vitamin (MULTIVITAMIN PO)    Taking 01/28/14 1429    SIMVASTATIN PO    Taking 01/28/14 1429        Vernon Chen, Vernon Chen <[id number]> - 63 y.o. Male       Medical History     Hypertension Hyperlipidemia    Murmur GERD (gastroesophageal reflux disease)    BBB (bundle branch block) Sinus disorder      Surgical History     KNEE ARTHROSCOPY HAND SURGERY    TUMOR REMOVAL COLONOSCOPY    KNEE SURGERY JOINT REPLACEMENT      Substance History     Smoking Status: Never Smoker     Smokeless Tobacco Status: Unknown    Alcohol use: 3.0 oz per week    Drug use: Not Asked      Problem List     Hypertension    Hyperlipidemia    GERD (gastroesophageal reflux disease)  Allergies: Review of patient's allergies indicates no known allergies.    NPO Status:                                                        Anesthesia Plan    ASA 2     general   (CONSENT FOR ANESTHESIA  Plan/risks/benefits/options explained. All questions answered. Patient agrees:yes)  intravenous induction   Anesthetic plan and risks discussed with patient.          Rudie Meyer, MD  01/29/2014

## 2014-01-29 NOTE — H&P (Signed)
Vernon Chen    [id number]    [facility] Update H & P  Department of General Surgery   Surgical Service   Physician Assistant Pre-operative History and Physical  Last H & P within the last 30 days.    DIAGNOSIS:   RIGHT KNEE OSTEOARTHRITIS    PROCEDURE:  Right Total Knee Arthroplasty      HISTORY OF PRESENT ILLNESS:   Patient has chronic right knee pain, swelling, locking and a feeling of giving out that is unresponsive to conservative treatments. S/P left TKR.     Past Medical History:        Diagnosis Date   ??? Hypertension    ??? Hyperlipidemia    ??? Murmur    ??? GERD (gastroesophageal reflux disease)    ??? BBB (bundle branch block) RIGHT   ??? Sinus disorder      Past Surgical History:        Procedure Laterality Date   ??? Knee arthroscopy       also revision for sepsis   ??? Hand surgery     ??? Tumor removal     ??? Colonoscopy     ??? Knee surgery  05/11/2011     LEFT TOTAL KNEE ARTHROPLASTY             ??? Joint replacement       Past Social History:  History     Social History   ??? Marital Status: Married     Spouse Name: N/A     Number of Children: N/A   ??? Years of Education: N/A     Social History Main Topics   ??? Smoking status: Never Smoker    ??? Smokeless tobacco: None   ??? Alcohol Use: 3.0 - 3.6 oz/week     2-3 Not specified, 3 Glasses of wine per week      Comment: socially   ??? Drug Use: None   ??? Sexual Activity: None     Other Topics Concern   ??? None     Social History Narrative         Medications Prior to Admission:      Prior to Admission medications    Medication Sig Start Date End Date Taking? Authorizing Provider   methylphenidate (RITALIN) 20 MG tablet  12/06/13  Yes Historical Provider, MD   omeprazole (PRILOSEC) 20 MG capsule Take 20 mg by mouth daily   Yes Historical Provider, MD   fexofenadine (ALLEGRA ALLERGY) 180 MG TABS Take by mouth   Yes Historical Provider, MD   LOSARTAN POTASSIUM PO Take 50 mg by mouth daily.   Yes Historical Provider, MD   vitamin B-12 (CYANOCOBALAMIN) 1000 MCG  tablet Take 1,000 mcg by mouth daily    Historical Provider, MD   polycarbophil (FIBERCON) 625 MG tablet Take 625 mg by mouth daily TAKES TWO A DAY    Historical Provider, MD   vitamin D 1000 UNITS CAPS Take by mouth    Historical Provider, MD   sodium chloride (OCEAN, BABY AYR) 0.65 % nasal spray 1 spray by Nasal route as needed for Congestion    Historical Provider, MD   aspirin 81 MG tablet Take 81 mg by mouth daily.    Historical Provider, MD   Multiple Vitamin (MULTIVITAMIN PO) Take  by mouth daily.      Historical Provider, MD   SIMVASTATIN PO Take 40 mg by mouth daily.    Historical Provider, MD  Allergies:  Review of patient's allergies indicates no known allergies.    PHYSICAL EXAM:      BP 144/87    Pulse 71    Temp(Src) 97.7 ??F (36.5 ??C) (Oral)    Resp 14    Ht 5\' 10"  (1.778 m)    Wt 245 lb (111.131 kg)    BMI 35.15 kg/m2      SpO2 97%      Heart:  regular rate and rhythm, no murmur    Lungs:  No increased work of breathing, good air exchange, clear to auscultation bilaterally, no crackles or wheezing    Abdomen:  soft, non-distended, non-tender, no rebound tenderness or guarding, normal active bowel sounds and no masses palpated    ASSESSMENT AND PLAN:    1.  Patient seen and focused exam done today- no new changes since last physical exam on 01-20-2014    2.  Access to ancillary services are available per request of the provider.    [place], [place]     01/29/2014

## 2014-01-29 NOTE — Consults (Signed)
Hospital Medicine Consult History & Physical      PCP: No primary provider on file.    Date of Admission: 01/29/2014    Date of Service: Pt seen/examined on 01-29-14 in medical consultation    Consult for medical management of HTN    Chief Complaint:  Elective right knee surgery      History Of Present Illness:      The patient is a 63 y.o. male with a PMH significant for HTN, HLD ad Arthritis who presents to [facility] for elective right knee surgery, right TKR performed today without complications, he is now eating, deneis dyspnea, chest pain and his knee pain is well controlled.     Past Medical History:        Diagnosis Date   ??? Hypertension    ??? Hyperlipidemia    ??? Murmur    ??? GERD (gastroesophageal reflux disease)    ??? BBB (bundle branch block) RIGHT   ??? Sinus disorder        Past Surgical History:        Procedure Laterality Date   ??? Knee arthroscopy       also revision for sepsis   ??? Hand surgery     ??? Tumor removal     ??? Colonoscopy     ??? Joint replacement Left 05/11/2011     left total knee   ??? Joint replacement Right 01/29/2014     right total knee       Medications Prior to Admission:    Prior to Admission medications    Medication Sig Start Date End Date Taking? Authorizing Provider   methylphenidate (RITALIN) 20 MG tablet  12/06/13  Yes Historical Provider, MD   vitamin B-12 (CYANOCOBALAMIN) 1000 MCG tablet Take 1,000 mcg by mouth daily   Yes Historical Provider, MD   polycarbophil (FIBERCON) 625 MG tablet Take 625 mg by mouth daily TAKES TWO A DAY   Yes Historical Provider, MD   vitamin D 1000 UNITS CAPS Take by mouth   Yes Historical Provider, MD   sodium chloride (OCEAN, BABY AYR) 0.65 % nasal spray 1 spray by Nasal route as needed for Congestion   Yes Historical Provider, MD   omeprazole (PRILOSEC) 20 MG capsule Take 20 mg by mouth daily   Yes Historical Provider, MD   fexofenadine (ALLEGRA ALLERGY) 180 MG TABS Take by mouth   Yes Historical Provider, MD   LOSARTAN POTASSIUM PO Take 50 mg by mouth  daily.   Yes Historical Provider, MD   Multiple Vitamin (MULTIVITAMIN PO) Take  by mouth daily.     Yes Historical Provider, MD   SIMVASTATIN PO Take 40 mg by mouth daily.   Yes Historical Provider, MD   aspirin 81 MG tablet Take 81 mg by mouth daily.    Historical Provider, MD       Allergies:  Review of patient's allergies indicates no known allergies.    Social History:  The patient currently lives at home    TOBACCO:   reports that he has never smoked. He does not have any smokeless tobacco history on file.  ETOH:   reports that he drinks about 3.0 - 3.6 oz of alcohol per week.      Family History:  Reviewed in detail and negative for DM, Early CAD, Cancer, CVA. Positive as follows:    History reviewed. No pertinent family history.    REVIEW OF SYSTEMS:   Positive as noted in the HPI. All other  systems reviewed and negative.    PHYSICAL EXAM:    BP 161/80    Pulse 84    Temp(Src) 98.1 ??F (36.7 ??C) (Temporal)    Resp 18    Ht 5\' 10"  (1.778 m)    Wt 245 lb (111.131 kg)    BMI 35.15 kg/m2      SpO2 95%      BP 170/95    Pulse 80    Temp(Src) 98 ??F (36.7 ??C) (Oral)    Resp 18    Ht 5\' 10"  (1.778 m)    Wt 245 lb (111.131 kg)    BMI 35.15 kg/m2      SpO2 94%     General Appearance:    Alert, cooperative, no distress, appears stated age, obese   Head:    Normocephalic, without obvious abnormality, atraumatic   Eyes:    PERRL, conjunctiva/corneas clear, EOM's intact, fundi     benign, both eyes                Throat:   Lips, mucosa, and tongue normal; teeth and gums normal   Neck:   Supple, symmetrical, trachea midline, no adenopathy;        thyroid:  No enlargement/tenderness/nodules; no carotid    bruit or JVD   Back:     Symmetric, no curvature, ROM normal, no CVA tenderness   Lungs:     Clear to auscultation bilaterally, respirations unlabored   Chest wall:    No tenderness or deformity   Heart:    Regular rate and rhythm, S1 and S2 normal, no murmur, rub   or gallop   Abdomen:     Soft, non-tender, bowel sounds  active all four quadrants,     no masses, no organomegaly           Extremities:   Right knee in dry bandages with warm and pink toes, other Extremities normal, atraumatic, no cyanosis or edema   Pulses:   2+ and symmetric all extremities   Skin:   Skin color, texture, turgor normal, no rashes or lesions   Lymph nodes:   Cervical, supraclavicular, and axillary nodes normal   Neurologic:   CNII-XII intact. Normal strength, sensation and reflexes       throughout             CBC   No results for input(s): WBC, HGB, HCT, PLT in the last 72 hours.   RENAL  Recent Labs      01/29/14   0715   NA  140   K  4.6   CL  101   CO2  31   BUN  21*   CREATININE  0.8     LFT'S  Recent Labs      01/29/14   0715   AST  28   ALT  34   BILITOT  0.6   ALKPHOS  49     COAG  Recent Labs      01/29/14   0715   INR  1.01     CARDIAC ENZYMES  No results for input(s): CKTOTAL, CKMB, CKMBINDEX, TROPONINI in the last 72 hours.    U/A:    No results found for this basename: NITRITE,  COLORU,  WBCUA,  RBCUA,  MUCUS,  BACTERIA,  CLARITYU,  SPECGRAV,  LEUKOCYTESUR,  BLOODU,  GLUCOSEU,  KETONESU,  AMORPHOUS       ABG    No results found for this basename: HCO3ART,  BEART,  O2SATART,  PHART,  THGBART,  PCO2ART,  PO2ART,  TCO2ART           Active Hospital Problems    Diagnosis Date Noted   ??? Arthritis [716.90] 01/29/2014   ??? Hypertension [401.9]    ??? GERD (gastroesophageal reflux disease) [530.81]          ASSESSMENT/PLAN:    1. S/p Rt TKR - mx per orthopedics including dvt ppx and pt  2. HTN - controled, continue home meds  3. GERD - stable, meds  4. HLD - stable, resume simvastatin           Glynis Smiles, MD    Thank you No primary provider on file. for the opportunity to be involved in this patient's care. If you have any questions or concerns please feel free to contact me at [phone].

## 2014-01-29 NOTE — Progress Notes (Signed)
Wife back to waiting room.  Nurse called to come get report

## 2014-01-29 NOTE — Other (Signed)
Patient Acct Nbr:  [id number]  Primary AUTH/CERT:  [id number]  Primary Insurance Company Name:   [facility]  Primary Insurance Group Number:  [id number]  Primary Insurance Plan Type: N  Primary Insurance Policy Number:  [id number]

## 2014-01-29 NOTE — Op Note (Signed)
PATIENT NAME:                 PA #:            MR #Vernon Chen, Vernon Chen                [id number]       [id number]            SURGEON:                              SURG DATE:  DIS DATE:          Audie Pinto, MD                01/29/2014                     PRIMARY CARE PHYSICIAN:               REFERRING PHYSICIAN:            BARRY STEVEN TALESNICK                                                DATE OF BIRTH:   AGE:           PATIENT TYPE:     RM #:              03-22-1951       63             IPJ               JSU                   PREOPERATIVE DIAGNOSIS(ES):  Degenerative osteoarthritis, tricompartmental,  right knee.       POSTOPERATIVE DIAGNOSIS(ES):  Degenerative osteoarthritis, tricompartmental,  right knee.       PROCEDURE(S) PERFORMED:  Right total knee joint replacement with correction  of coronal and sagittal malalignment utilizing Journey II posterior cruciate  sacrificing system and patient-specific Visionaire instrumentation.       SURGEON:  Darlys Gales, MD     ASSISTANT:  Dr. Cliffton Asters.     ANESTHESIA:  General.      OPERATIVE INDICATION:  The patient understood the associated risks, benefits,  and consents to the operative procedure having undergone a successful left  total joint replacement and was advised of the Visionaire system for the  right knee which in my opinion represented a significant advance in terms of  computerized technology.       OPERATIVE FINDINGS:  Severe degenerative osteoarthritis throughout the knee  joint with excellent correction of coronal and sagittal alignment and  excellent fitting of the Visionaire cutting blocks to the patient.       DETAILS OF PROCEDURE: This patient was placed in the supine position upon the  operating room table and after a satisfactory level of general anesthesia the  right knee was prepped and draped to a sterile surgical field after  identification of the signed limb and the timeout procedure.       Under tourniquet control a medial  parapatellar incision was made with  dissection carried through the skin and subcutaneous tissue, everting  the  patella, cutting it to a 15-mm base, with a 3 sockets and maintaining a  normal 24-mm height.       The femur was cut utilizing the Visionaire system instrumentation which  allowed an excellent fit in terms of rotation, flexion, extension, and the  distal resection cut.  The #8 five-stage cutting block was applied and again  had an excellent fit.  After the bone fragments were removed the prosthesis  showed an excellent adaption to the chamber cuts.  At this point gap analysis  was performed and then the gouge instrumentation was utilized for the center  femoral notch.       The tibia was cut with an extramedullary system which allowed for a very  precise dialing in.  This did represent a complex tibia with marked  osteophytes and different concavities.  A total of 9 mm was removed from the  lateral side which provided subsequently an excellent gap for both flexion  and extension.      The lamina spreader was utilized to gap open the knee joint and all remaining  meniscus tissue, loose bodies and osteophytes were removed with  electrocoagulation of the medial and lateral meniscus bed.  The final trial  prosthesis was placed into the knee joint and a 10-mm trial found to have an  excellent fit allowing 1 degree of hyperextension, neutral alignment from a  coronal and sagittal standpoint, and correcting again the flexion  contracture.  The gap analysis was excellent throughout low flexion, mid and  high flexion.       The tibia and femur and patella were prepared for cement fixation which then  commenced in that order removing cement at each step and holding the knee in  full extension while the cement cured with the patellar dome intact.  The  tranexamic acid was placed locally into the knee joint and the Orthomix  placed deep IM medially and laterally for pain control.      The tourniquet was inflated.   Hemostasis was excellent.  Final irrigation,  final gap analysis with stability shown, and then final placement of the 10  mm permanent polyethylene.       The wound was closed in sequential layers first balancing the extensor  mechanism requiring a mini lateral release which was done in the Z-plasty way  closing this defect.  The medial structures were tensioned initially with 3  interrupted 0 Ethibond sutures followed by #2 Manson Allan followed by repeat  antibiotic irrigation followed by closure of the wound proceeding from deep  to superficial with interrupted 2-0, 3-0, 4-0 Monocryl and skin adhesive,  followed an ACE cotton compression dressing.  Returned to the recovery unit  in excellent condition.  Estimated blood loss 100 mL without complication.                                              Audie Pinto, MD     [id number]  DD: 01/29/2014 10:44  DT: 01/29/2014 11:04  Job #: [id number]  CC: Audie Pinto, MD

## 2014-01-29 NOTE — Brief Op Note (Signed)
Brief Postoperative Note    Vernon Chen  Date of Birth:  03-13-1951  [id number]    Pre-operative Diagnosis: Right knee DJD    Post-operative Diagnosis: Same    Procedure: Right knee TKA    Anesthesia: General and Nerve Block    Surgeons/Assistants: Noyes/Scott Fix    Estimated Blood Loss: 100     Complications: None    Specimens: Was Not Obtained    Findings: see op note    Electronically signed by [id number] Nathanial Millman, DO on 01/29/2014 at 11:01 AM

## 2014-01-29 NOTE — Progress Notes (Signed)
Wife back to visit

## 2014-01-29 NOTE — Plan of Care (Signed)
Current Allergies: Review of patient's allergies indicates no known allergies.      [facility] PACU Education and Care Plan Goals  The following items will be achieved upon completion of the patient's transfer or discharge from the PACU:    Respiratory Safety/Patent Airway  _0   Pt will leave PACU with oxygen saturations greater than 92% on room air or within 10% of patient's initial admission oxygen saturations.    Post Operative Pain Management                                                                               _1  Patient will verbalize understanding of pain scale and pain management.  _2  Patient achieves predetermined pain goal of 4  _3  Self reports a comfort level acceptable for discharge  _4  Other     Fall Risk Potential  _5  Due to Perioperative medication administration  Additional Risk Identified:   _6  Sensory deficit         _7  Motor deficit         _8  Balance problem         _9  Home medication         _10  Uses assistive device to ambulate    Goal(s) for fall prevention:  _11  Prevent fall or injury by calling for assistance with activity and use of siderails while hospitalized  _12  Prevent fall or injury by using assistance with activity after discharge.  _13  Patient / Significant other verbalize understanding in use of any ordered assistive devices    Mobility Safety/ ADL  _14  Reach a functional mobility goal within limitations of the procedure.    Infection Precautions                                                                                                            _15 Patient/Significant Other understands implementation of infection precautions (see [facility] Presurgical Instructions and SSI Prevention Handout)    Post operative Assessment and Care                                                             _16  Standards of care met as delineated by ASPAN.  _17  Procedure specific goals for care as delineated by additional resources: Merry Lofty in [place]; [place],  [place]; Venetia Maxon and Venetia Maxon, Instructions for Surgery Pts.  Discharge Education and Goals  _0 Patient voices understanding of PACU discharge criteria  _1 Outpatient / significant other voices understanding of home care and follow up procedures (See [facility] Procedure Discharge Instructions)  _2  Patient / significant other understanding of Special Needs:  _3  Cooling device  _4 Wound Support Device  _5  Crutches   _6 Drain    _7  Walker   _8 Other  _9  Inpatient / significant other understands the plan for transfer to the inpatient unit

## 2014-01-29 NOTE — Progress Notes (Signed)
Incentive Spirometry education and demonstration completed by Respiratory Therapy.  Turning over to Nursing for routine follow-up.  Minimum Predicted Vital Capacity - 1129 mL.  Patient's Actual Vital Capacity - 2500 mL.

## 2014-01-29 NOTE — Plan of Care (Signed)
The following education and goals will be achieved upon completion of the patient's care in SDS:    IdIdentify the learner who is being assessed for education: Patient  Ability to Learn:  Exhibits ability to grasp concepts and respond to questions: High  Ready to Learn: Yes  anxious  Preferred Method of Learning:  written  Barriers to Learning: Verbalizes interest  Special Considerations due to cultural, religious, spiritual beliefs:  no  Language: [place]  Language Interpreter: no    [place]  [x] Appropriate evaluation / integration of data as delineated by ASPAN Standards of [employer].    Pain scale and pain management  [x] Patient will verbalize understanding of pain scale and pain management.  [x] Pre-operative determination of patient???s anticipated Post-Operative pain goal: 4 of 10 on 10 point scale post op goal  [x] Patient will verbalize plan for pain management  [] Other     Compliance with Pre-op Instructions - Patient reports compliance with:  [x] Taking prescribed home meds before arrival  [x] Surgical prep instructions specific to the patient's surgery    Fall Risk - PreOperatively   [] No preoperative risk identified  [x] Preop risk identified:      []  Sensory deficit     []  Motor deficit     []  Balance problem     []  Home medication     [] Uses assistive device to ambulate      [] History of a Fall within the last 30 days  [x] Due to Perioperative medication administration    Goal(s) for fall prevention:  [x]  Prevent fall or injury by use of encouraging call light for assistance, side rails, and assisting with activity.  [x] Patient / Significant other verbalize understanding of need to call for assistance prior to getting out of bed.    Infection Precautions                                                                                                   [x]  Patient understands implementation of [facility] Infection precautions  [x]  Handwashing, skin  prep prior to IV insertion, hair clipped at surgical site if needed  [x]  Pre op antibiotic, if ordered    Patient Safety  [x]  Patient identification  [x]  Site verification (See Universal Protocol Checklist)  [x]  General Safety precautions  [x]  Side rails up, bed/stretcher low position and wheels locked  [x]  Call light within reach  [x]  Patient instructed to call for assistance prior to getting out of bed    Instructions - Discharge planning for Outpatients  [x]  Patient / significant other voices understanding of home care and follow up procedures.  Anticipated Special Needs upon discharge:  [x]  Cooling device  []  Crutches  []  Walker  []  Wound Support device   [] Drain  [] Other   [x]  See [facility] Procedure Discharge Instructions    Instructions - Discharge planning for Admitted Patients  [x]  Patient / Significant other understands plan for admission after surgery  []  Patient / Significant other understands plan for anticipated discharge disposition  01/29/2014 7:57 AM

## 2014-01-29 NOTE — Progress Notes (Signed)
Current Allergies: Review of patient's allergies indicates no known allergies.    Admitted to PACU from OR.  Attached to PACU monitoring system. Alarms and parameters set. Report received from anesthesia personnel. No problems reported intraoperatively.  Athrombic wraps in place.

## 2014-01-29 NOTE — Progress Notes (Signed)
PACU Transfer Note    Current Allergies: Review of patient's allergies indicates no known allergies.    Pt meets criteria to transfer to next phase of care.     Filed Vitals:    01/29/14 1300   BP: 161/80   Pulse: 84   Temp: 98.1 ??F (36.7 ??C)   Resp: 18       SpO2: 95 %    O2 Flow Rate (L/min): 3 L/min      Intake/Output Summary (Last 24 hours) at 01/29/14 1308  Last data filed at 01/29/14 1300   Gross per 24 hour   Intake   1850 ml   Output     75 ml   Net   1775 ml       Pain assessment:  present - adequately treated  Pain Level: 4    Handoff report given at bedside.       01/29/2014 1:08 PM

## 2014-01-29 NOTE — Plan of Care (Signed)
Pt drowsy but awake and orient x3  Reports pain to right knee "tolerable"  neurovasculoar checks WNL  +2 pp right foot  Ace wrap and DD intact to RLE  No drainage noted  Ice packs to right knee  Call light in reach  Review poc  Monitor and assess

## 2014-01-29 NOTE — Consults (Signed)
Clinical Pharmacy Consult Note    Subjective/Objective:  Pt admitted S/P R TKA,  POD #0.  Consulted by Dr. Cliffton Asters to dose Warfarin for post-op VTE prophylaxis.  Target INR = 1.8-2.2.    PMH significant for HTN and GERD.    Reviewed medications for possible drug-drug interactions with Warfarin.     Lab Results   Component Value Date    WBC 15.0* 05/12/2011    HGB 13.1* 05/12/2011    HCT 36.7* 05/12/2011    MCV 92.0 05/12/2011    PLT 311 05/12/2011       Lab Results   Component Value Date    PROTIME 10.9 01/29/2014    INR 1.01 01/29/2014     Assessment/Plan:  1. Anticoagulation - VTE prophylaxis s/p R TKA: pharmacy to dose warfarin    ?? Target INR 1.8-2.2  ?? Baseline INR = 1.01 7/22  ?? Will start warfarin 5 mg daily -- daily INR will be monitored and dose adjustments made as needed.    Date INR Warfarin   7/22 1.01                     Thank you for the consult! Please call with questions.  Dorthula Rue, PharmD, BCPS  Wireless # [phone]  01/29/2014 2:15 PM

## 2014-01-29 NOTE — Plan of Care (Signed)
Problem: Pain - Acute:  Goal: Pain level will decrease  Pain level will decrease  Pt with pain to right knee 7/10.  Pt does not radiate.  Pt given pain medication per mar.  PT reminded to call if pain is not relieved or increases.  Will continue to monitor.

## 2014-01-30 LAB — BASIC METABOLIC PANEL
Anion Gap: 9 (ref 3–16)
BUN: 20 mg/dL (ref 7–20)
CO2: 26 mmol/L (ref 21–32)
Calcium: 9.3 mg/dL (ref 8.3–10.6)
Chloride: 103 mmol/L (ref 99–110)
Creatinine: 0.8 mg/dL (ref 0.8–1.3)
GFR African American: >60 (ref >60)
GFR Non-African American: >60 (ref >60)
Glucose: 152 mg/dL — ABNORMAL HIGH (ref 70–99)
Potassium: 4.5 mmol/L (ref 3.5–5.1)
Sodium: 138 mmol/L (ref 136–145)

## 2014-01-30 LAB — CBC
Hematocrit: 37.4 % — ABNORMAL LOW (ref 40.5–52.5)
Hemoglobin: 13.1 g/dL — ABNORMAL LOW (ref 13.5–17.5)
MCH: 32.3 pg (ref 26.0–34.0)
MCHC: 35 g/dL (ref 31.0–36.0)
MCV: 92.2 fL (ref 80.0–100.0)
MPV: 8.1 fL (ref 5.0–10.5)
Platelets: 267 10*3/uL (ref 135–450)
RBC: 4.05 M/uL — ABNORMAL LOW (ref 4.20–5.90)
RDW: 13 % (ref 12.4–15.4)
WBC: 13 10*3/uL — ABNORMAL HIGH (ref 4.0–11.0)

## 2014-01-30 LAB — PROTIME-INR
INR: 1.13 (ref 0.85–1.16)
Protime: 12.3 s (ref 9.1–12.6)

## 2014-01-30 LAB — APTT: aPTT: 28.4 s (ref 23.1–36.1)

## 2014-01-30 MED ADMIN — sodium chloride flush 0.9 % injection 10 mL: 10 mL | INTRAVENOUS | @ 01:00:00

## 2014-01-30 MED ADMIN — methylphenidate (RITALIN) tablet 20 mg: 20 mg | ORAL | @ 14:00:00 | NDC 00781574901

## 2014-01-30 MED ADMIN — ceFAZolin (ANCEF) 2 g in dextrose 5% 100 mL IVPB: 2 g | INTRAVENOUS | @ 06:00:00 | NDC 09999990046

## 2014-01-30 MED ADMIN — oxyCODONE-acetaminophen (PERCOCET) 5-325 MG per tablet 2 tablet: 2 | ORAL | @ 08:00:00 | NDC 00406051223

## 2014-01-30 MED ADMIN — simvastatin (ZOCOR) tablet 40 mg: 40 mg | ORAL | @ 14:00:00 | NDC 68084051311

## 2014-01-30 MED ADMIN — vitamin B-12 (CYANOCOBALAMIN) tablet 1,000 mcg: 1000 ug | ORAL | @ 14:00:00 | NDC 00904421713

## 2014-01-30 MED ADMIN — docusate sodium (COLACE) capsule 100 mg: 100 mg | ORAL | @ 01:00:00 | NDC 62584068311

## 2014-01-30 MED ADMIN — losartan (COZAAR) tablet 50 mg: 50 mg | ORAL | @ 14:00:00 | NDC 00904639061

## 2014-01-30 MED ADMIN — oxyCODONE-acetaminophen (PERCOCET) 5-325 MG per tablet 2 tablet: 2 | ORAL | @ 13:00:00 | NDC 00406051223

## 2014-01-30 MED ADMIN — oxyCODONE HCl ER CR tablet 10 mg: 10 mg | ORAL | @ 01:00:00 | NDC 59011041020

## 2014-01-30 MED ADMIN — pantoprazole (PROTONIX) tablet 40 mg: 40 mg | ORAL | @ 14:00:00 | NDC 51079005101

## 2014-01-30 MED ADMIN — warfarin (COUMADIN) tablet 5 mg: 5 mg | ORAL | @ 22:00:00 | NDC 00056017201

## 2014-01-30 MED ADMIN — Dexamethasone Sodium Phosphate injection 5 mg: 5 mg | INTRAVENOUS | @ 18:00:00 | NDC 63323016505

## 2014-01-30 MED ADMIN — Dexamethasone Sodium Phosphate injection 5 mg: 5 mg | INTRAVENOUS | @ 06:00:00 | NDC 63323016505

## 2014-01-30 MED ADMIN — aspirin EC tablet 81 mg: 81 mg | ORAL | @ 14:00:00 | NDC 63739052210

## 2014-01-30 MED ADMIN — oxyCODONE HCl ER CR tablet 10 mg: 10 mg | ORAL | @ 14:00:00 | NDC 59011041020

## 2014-01-30 MED ADMIN — docusate sodium (COLACE) capsule 100 mg: 100 mg | ORAL | @ 14:00:00 | NDC 62584068311

## 2014-01-30 MED ADMIN — oxyCODONE-acetaminophen (PERCOCET) 5-325 MG per tablet 2 tablet: 2 | ORAL | @ 17:00:00 | NDC 00406051223

## 2014-01-30 MED FILL — MIDAZOLAM HCL 2 MG/2ML IJ SOLN: 2 MG/ML | INTRAMUSCULAR | Qty: 2

## 2014-01-30 MED FILL — OXYCONTIN 10 MG PO T12A: 10 MG | ORAL | Qty: 1

## 2014-01-30 MED FILL — PERCOCET 5-325 MG PO TABS: 5-325 MG | ORAL | Qty: 2

## 2014-01-30 MED FILL — FENTANYL CITRATE 0.05 MG/ML IJ SOLN: 0.05 MG/ML | INTRAMUSCULAR | Qty: 5

## 2014-01-30 MED FILL — COUMADIN 5 MG PO TABS: 5 MG | ORAL | Qty: 1

## 2014-01-30 MED FILL — METHYLPHENIDATE HCL 10 MG PO TABS: 10 MG | ORAL | Qty: 2

## 2014-01-30 MED FILL — PROTONIX 40 MG PO TBEC: 40 MG | ORAL | Qty: 1

## 2014-01-30 MED FILL — DOCUSATE SODIUM 100 MG PO CAPS: 100 MG | ORAL | Qty: 1

## 2014-01-30 MED FILL — ASPIRIN EC 81 MG PO TBEC: 81 MG | ORAL | Qty: 1

## 2014-01-30 MED FILL — VITAMIN B-12 1000 MCG PO TABS: 1000 MCG | ORAL | Qty: 1

## 2014-01-30 MED FILL — CEFAZOLIN 2000 MG IN D5W 100 ML IVPB: Qty: 2

## 2014-01-30 MED FILL — SIMVASTATIN 40 MG PO TABS: 40 MG | ORAL | Qty: 1

## 2014-01-30 MED FILL — DEXAMETHASONE SODIUM PHOSPHATE 20 MG/5ML IJ SOLN: 20 MG/5ML | INTRAMUSCULAR | Qty: 5

## 2014-01-30 MED FILL — FIBERTAB 625 MG PO TABS: 625 MG | ORAL | Qty: 1

## 2014-01-30 MED FILL — LOSARTAN POTASSIUM 50 MG PO TABS: 50 MG | ORAL | Qty: 1

## 2014-01-30 NOTE — Consults (Signed)
Dietitian Consult received for patient s/p orthopedic surgery  Total knee replacement status [V43.65].  Admission Date: 01/29/2014      Timepoint: Consult Day #1 of admission  Marita KansasMichael E Noreen 63 y.o.   male     Subjective:    Pt states he has worked with a Systems analystpersonal trainer and a dietitian in the past so he knows what he should be doing.  Appears motivated to lose weight.  Consult received for diet education if BMI is greater than 35 kg/(Mailyn Steichen^2).   Current body Weight: 245 lb (111.131 kg) = Body mass index is 35.15 kg/(Kenden Brandt^2).    Patient educated on Steps for Weight Loss from [facility]. Handout provided on Steps for Weight Loss from [facility].  Patient referred to [facility]. Information provided on services offered at [facility].     Anthropometric Measures   Height: 5\' 10"  (177.8 cm)    Current Weight: Weight: 245 lb (111.131 kg) Weight Method: Stated   Admission weight: 245 lb (111.131 kg)   Ideal Body Weight: 166 lb   +/-10%       Percent Ideal Body Weight: 147 %    BMI Interpretation Body mass index is 35.15 kg/(Jaculin Rasmus^2). at [facility]  Less than 18.5 Underweight  18.5-24.9 Normal Weight  25-29.9 Overweight  30-34.9 Obese Class I    35-39.9 Obese Class II  Greater than or equal to 40 Obese Class III.           Patient with low nutrition risk found at this time.  Will reassess in 4-5 days. Consult dietitian if intervention is needed before that time.     Corinna CapraM. Chris Meztli Llanas, RD, LD  Pager: [phone]  Office: [phone]

## 2014-01-30 NOTE — Progress Notes (Signed)
Occupational Therapy   Occupational Therapy Initial Assessment /Treatment/Discharge  Date: 01/30/2014   Patient Name: Vernon Chen  MRN: [id number]     DOB: [birth year]           Restrictions  Position Activity Restriction  Other position/activity restrictions: fwbat, high fall risk  Vision/Hearing  Vision  Vision: Within Functional Limits  Hearing  Hearing: Within functional limits  Subjective   General  Chart Reviewed: Yes  Additional Pertinent Hx: 01/29/14 Rt TKR   h/o: HTN, GERD, murmur, Lft TKR, Knee arthroscopy  Family / Caregiver Present: No  Referring Practitioner: Dr. Cliffton Asters  Diagnosis: Rt knee DJD  Subjective  Subjective: Pt plans to stay at hotel  (handicapped accessible) for a couple weeks while getting therapy at Dr. Bryson Ha office  General Comment  Comments: Pt's goal is to return to work  Pain Screening  Patient Currently in Pain: Yes  Pain Assessment: 0-10  Pain Level: 4  Pain Type: Surgical pain  Pain Location: Knee  Pain Orientation: Right  Home Living  Home Living  Lives With: Spouse  Type of Home: House  Home Layout: Multi-level, Bed/Bath upstairs, 1/2 bath on main level  Bathroom Shower/Tub: Pension scheme manager: Standard  Home Equipment: Crutches  Additional Comments: Pt going to stay at hotel for 2 weeks to get PT with Jana Hakim.       Orientation  Orientation  Overall Orientation Status: Within Normal Limits  Objective   IADL History  Homemaking Responsibilities: Yes  Active Driver: Yes  Occupation: Full time employment  Type of occupation: [employer] several days a week  Prior Psychologist, sport and exercise Help From: Family  ADL Assistance: Independent  Homemaking Assistance: Independent  Ambulation Assistance: Independent     Balance  Sitting Balance: Independent  Standing Balance: Stand by Armed forces operational officer - Technique: Ambulating (via crutches and SBA)  Equipment Used: Standard toilet  Transfer Level: Stand by assistance  ADL  LE Dressing: Minimal assistance (increased  difficulty reaching Rt foot.)  Additional Comments: Pt states his wife can assist hm as needed  Tone RUE  RUE Tone: Normotonic  Tone LUE  LUE Tone: Normotonic  Coordination  Movements Are Fluid And Coordinated: Yes  Functional Activity Tolerance  Functional Activity Tolerance: Endurance does not limit participation in activity        Vision - Basic Assessment  Prior Vision: Wears contacts  Cognition  Overall Cognitive Status: WNL     ROM  ROM: WFL  LUE Strength  Gross LUE Strength: WFL  RUE Strength  Gross RUE Strength: WFL    Treatment included ADL and transfer training.  Assessment   Assessment  Comments: Pt doing well POD 1.  Increased difficulty reaching Rt foot secondary to leg wrap.  Pt's wife is available to assist as needed.  Rehab Potential: Good  Recommendations: Home with assist as needed  Goal Formulation: Patient  No Skilled OT: Safe to return home;No acute OT goals identified  Requires OT Follow Up: No  Timed Code Treatment Minutes: 18 Minutes  Total Treatment Time: 30  Response to Treatment  Response to Treatment: Patient Tolerated treatment well  Safety Devices  Safety Devices in place: Yes  Type of devices: Left in chair;Call light within reach  OT D/C Equipment  Equipment Needed: No       Plan    Discharge pt from acute OT  Patient Education: Role of OT:  Pt verbalized understanding    G-Code  OT G-codes  Functional Limitation: Self care  Self Care Current Status [phone](G8987): At least 1 percent but less than 20 percent impaired, limited or restricted  Self Care Goal Status ([id number](G8988): At least 1 percent but less than 20 percent impaired, limited or restricted  Self Care Discharge Status [phone](G8989): At least 1 percent but less than 20 percent impaired, limited or restricted    Goals    No goals indicated       Derrill KayKathleen Andrews  License and Documentation Cosign  Therapy License Number: OTR/L [id number]

## 2014-01-30 NOTE — Plan of Care (Signed)
Problem: Musculor/Skeletal Functional Status  Intervention: PT Evaluation/treatment  Increase patient to functional baseline.

## 2014-01-30 NOTE — Progress Notes (Addendum)
Physical Therapy  [facility]  Daily Treatment Note  NAME: Marita KansasMichael E Cullers  DOB: 09/17/1950  MRN: [id number]    Date of Service: 01/30/2014    Patient Diagnosis(es):   Patient Active Problem List    Diagnosis Date Noted   ??? Arthritis 01/29/2014   ??? Hypertension    ??? Hyperlipidemia    ??? GERD (gastroesophageal reflux disease)        Past Medical History   Diagnosis Date   ??? Hypertension    ??? Hyperlipidemia    ??? Murmur    ??? GERD (gastroesophageal reflux disease)    ??? BBB (bundle branch block) RIGHT   ??? Sinus disorder      Past Surgical History   Procedure Laterality Date   ??? Knee arthroscopy       also revision for sepsis   ??? Hand surgery     ??? Tumor removal     ??? Colonoscopy     ??? Joint replacement Left 05/11/2011     left total knee   ??? Joint replacement Right 01/29/2014     right total knee       Restrictions  Position Activity Restriction  Other position/activity restrictions: fwbat, high fall risk  Subjective   General  Chart Reviewed: Yes  Additional Pertinent Hx: HTN, HLD, murmur,  GERD, BBB, sinus disorder,  Left TKR  Referring Practitioner: Noyaes  Subjective  Subjective: Pt agreeable to working with PT  General Comment  Comments: Pt goal - get back to work and normal life  Pain Screening  Patient Currently in Pain: Yes (3/10)  Vital Signs  Patient Currently in Pain: Yes (3/10)       Orientation  Orientation  Overall Orientation Status: Within Normal Limits  Objective   Bed Mobility  Supine to Sit: Contact guard assistance  Sit to Supine: Stand by assistance  Transfers  Sit to Stand: Stand by assistance  Stand to sit: Stand by assistance  Ambulation  Ambulation?: Yes  Ambulation 1  Surface: level tile  Device: Axillary Crutches  Assistance: Stand by assistance  Quality of Gait: Step through gait, good sequencing and stability cues to stop doing additional step.  Distance: 60 ft       Education  Patient educated regarding safety with functional mobility, transfers and gait. Pt  demonstrates good safety awareness.     Pt also educated regarding use of call light for safety with mobility.  Pt expresses understanding.    Pt educated in use of incentive spirometer and expresses understanding.    Exercises   AP, quad sets, glut sets x 10 with verbal cues  SLR, SAQ, FAQ, hip abduction, PROM, heel slides x 10 with min assist.     Assessment   Assessment  Assessment: Decreased ADL status;Decreased functional mobility   Assessment: Pt currently doing well POD one.  Plan is for pt to stay in hotel for 2weeks and receive OPPT prior to returning home to [place]  Prognosis: Good  Discharge Recommendations: Home with assist PRN  Specific recommendations for next treatment -  Pt going to hotel for 2 weeks, would practice steps with crutch and rail just in case he needs to do any steps.      Requires PT Follow Up: Yes  Timed Code Treatment Minutes: 30 Minutes  Total Treatment Time: 45  Activity Tolerance  Activity Tolerance: Patient Tolerated treatment well  PT D/C Equipment  Equipment Needed: No  Goals  Short term goals  Time Frame for Short term goals: Discharge  Short term goal 1: Bed mobility with supervision  Short term goal 2: Transfesr with SBA  Short term goal 3: Ambulate 100 ft with crutches and SBA  Short term goal 4: Up and down 4 steps with crutch, rail and SBA  Short term goal 5: Exercises per protocol x 10 reps with min assist    Plan    Plan  Times per week: 6-7  Times per day: Twice a day  Current Treatment Recommendations: Strengthening, Stair training, ROM, Building services engineer, Patent attorney, Pain Management, Investment banker, operational, Equities trader, Mining engineer, Education, Garment/textile technologist Devices in place: Yes  Type of devices: Call light within reach, Left in bed     Noah Charon, PT  License and Documentation Cosign  Therapy License Number: [id number]

## 2014-01-30 NOTE — Progress Notes (Signed)
[facility]  Progress Note  Total Knee Arthroplasty     Date:  01/30/2014    Name:  Vernon Chen  [id number]  [place] [id number]    DOB:  1950/08/16      Age:   63 y.o.    SSN:  [id number]      Medical Record Number:  [id number]    SUBJECTIVE:    Vernon Chen is a 63 y.o. year old male who is now POD #1  s/p right total knee arthroplasty.  Pain was controlled overnight.  Doing well with PT. There are no new complaints.     OBJECTIVE:  Infusions:  ??? sodium chloride 50 mL/hr at 01/29/14 1539       I/O:I/O last 3 completed shifts:  In: 1860 [P.O.:50; I.V.:1710; Other:100]  Out: 950 [Urine:875; Blood:75]           Wt Readings from Last 1 Encounters:   01/29/14 245 lb (111.131 kg)        LABS:  No results for input(s): WBC, HGB, HCT, MCV, PLT in the last 72 hours.     Recent Labs      01/29/14   0715   NA  140   K  4.6   CL  101   CO2  31   BUN  21*   CREATININE  0.8        Recent Labs      01/29/14   0715   AST  28   ALT  34   BILITOT  0.6   ALKPHOS  49      No results for input(s): LIPASE, AMYLASE in the last 72 hours.     Recent Labs      01/29/14   0715  01/29/14   1409   PROT  7.1   --    INR  1.01  1.05      No results for input(s): CKTOTAL, CKMB, CKMBINDEX, TROPONINI in the last 72 hours.          Exam:BP 145/80    Pulse 92    Temp(Src) 98.4 ??F (36.9 ??C) (Oral)    Resp 18    Ht 5\' 10"  (1.778 m)    Wt 245 lb (111.131 kg)    BMI 35.15 kg/m2      SpO2 93%    General appearance: alert, appears stated age and cooperative   Lungs: Breathing is unlabored   Heart: regular rate and rhythm   Abdomen: soft, nontender   GI: Positive Flatus    Neuro: Sensation is intact in foot.     Vascular: Toes are well perfused.      Right knee exam: dressing is clean, dry, and intact. Compartments soft and NT. 5+       DF/PF strength. 2+ palp PT/DP pulses. Calf soft and nontender with negative  homans. DNVI    ASSESSMENT:   POD #1 s/p right total knee arthroplasty    PLAN:     Analgesia: Pain control with combination of oral and IV analgesics.   Dressings: Change at first postop visit in office   DVT Prophylaxis: Coumadin/Mechanical   PT: Vernon Chen will participate in physical therapy today with emphasis on  ambulation and range of motion.    Disposition: D/C to home when pain controlled, cleared by PT, and medically stable  and cleared by hospitalist.        [id number] Nathanial MillmanJared Kalsey Lull  Clinical Fellow  [facility]    Date:  01/30/2014    I have personally performed and/or participated in the history, exam and medical decision making and agree with all pertinent clinical information.  I have also reviewed and agree with the past medical, family and social history unless otherwise noted.    Yancey FlemingsFrank R. Army MeliaNoyes, MD

## 2014-01-30 NOTE — Progress Notes (Signed)
Clinical Pharmacy Consult Note    Subjective/Objective:  Pt admitted S/P R TKA,  POD #1.  Consulted by Dr. Cliffton Asters to dose Warfarin for post-op VTE prophylaxis.  Target INR = 1.8-2.2.    PMH significant for HTN and GERD.    Reviewed medications for possible drug-drug interactions with Warfarin.     Lab Results   Component Value Date    WBC 13.0* 01/30/2014    HGB 13.1* 01/30/2014    HCT 37.4* 01/30/2014    MCV 92.2 01/30/2014    PLT 267 01/30/2014       Lab Results   Component Value Date    PROTIME 12.3 01/30/2014    INR 1.13 01/30/2014     Assessment/Plan:  1. Anticoagulation - VTE prophylaxis s/p R TKA: pharmacy to dose warfarin    ?? Target INR 1.8-2.2  ?? Baseline INR = 1.01 7/22  ?? INR today = 1.14 -- will continue warfarin 5 mg daily.   ?? If patient discharged today: recommend warfarin 5 mg daily. Would have INR checked by Monday 7/27.  ?? Daily INR will be monitored and dose adjustments made as needed.    Date INR Warfarin   7/22 1.01 5 mg   7/23 1.13                Please call with questions.  Dorthula Rue, PharmD, BCPS  Wireless # [phone]  01/30/2014 8:28 AM

## 2014-01-30 NOTE — Progress Notes (Addendum)
Physical Therapy  Facility/Department: [facility] 5 SOUTH SURGERY  Initial Assessment and Treatment    NAME: Vernon Chen  DOB: [birth year]  MRN: [id number]    Date of Service: 01/30/2014    Patient Diagnosis(es):   Patient Active Problem List    Diagnosis Date Noted   ??? Arthritis 01/29/2014   ??? Hypertension    ??? Hyperlipidemia    ??? GERD (gastroesophageal reflux disease)        Past Medical History   Diagnosis Date   ??? Hypertension    ??? Hyperlipidemia    ??? Murmur    ??? GERD (gastroesophageal reflux disease)    ??? BBB (bundle branch block) RIGHT   ??? Sinus disorder      Past Surgical History   Procedure Laterality Date   ??? Knee arthroscopy       also revision for sepsis   ??? Hand surgery     ??? Tumor removal     ??? Colonoscopy     ??? Joint replacement Left 05/11/2011     left total knee   ??? Joint replacement Right 01/29/2014     right total knee       Restrictions  Position Activity Restriction  Other position/activity restrictions: fwbat, high fall risk        Subjective  General  Chart Reviewed: Yes  Additional Pertinent Hx: HTN, HLD, murmur,  GERD, BBB, sinus disorder,  Left TKR  Referring Practitioner: Noyaes  Diagnosis: Right TKR 01/29/14  General Comment  Comments: Pt goal - get back to work and normal life    Subjective  Subjective: Pt agreeable to working with PT    Pain Screening  Patient Currently in Pain: Yes  Patient Currently in Pain: Yes (5-6/10)    Orientation  Orientation  Overall Orientation Status: Within Normal Limits     Home Living  Home Living  Lives With: Spouse  Type of Home:  (Gonig to stay at hoptel for 2 weeks with level entry and elevator,  no handicapped accessible bathroom available.)  Additional Comments: Pt going to stay at hotel for 2 weeks to get PT with Jana Hakim.    Objective   Prior Function  Receives Help From: Family  ADL Assistance: Independent  Homemaking Assistance: Independent  Ambulation Assistance: Independent      Bed Mobility  Supine to Sit: Contact guard assistance  Transfers  Sit to  Stand: Contact guard assistance  Stand to sit: Stand by assistance (with cues)  Ambulation  Ambulation?: Yes  Ambulation 1  Surface: level tile  Device: Agricultural consultant  Assistance: Stand by assistance  Quality of Gait: Step through gait, good sequencing and stability  Distance: 80 ft       Exercises   AP, quad sets, glut sets x 10 with verbal cues  SLR, SAQ, FAQ, hip abduction, PROM, heel slides x 10 with min assist.  PROM 15-65    Balance - Pt stood to urinate and perform self care at sink with good balance.  Able to stand as well to pull up boxers and shorts with good balance.    Education  Patient educated regarding safety with functional mobility, transfers and gait. Pt demonstrates good safety awareness.     Pt also educated regarding use of call light for safety with mobility.  Pt expresses understanding.    Pt educated in use of incentive spirometer and expresses understanding.      Treatment rendered and includes exercise per protocol, transfers and gait training,  and education regarding safety with functional mobility.    Assessment   Assessment  Assessment: Decreased ADL status;Decreased functional mobility   Assessment: Pt currently doing well POD one.  Plan is for pt to stay in hotel for 2weeks and receive OPPT prior to returning home to [place]  Prognosis: Good  Discharge Recommendations: Home with assist PRN  Requires PT Follow Up: Yes  Activity Tolerance  Activity Tolerance: Patient Tolerated treatment well  PT D/C Equipment  Equipment Needed: No       Plan   Plan  Times per week: 6-7  Times per day: Twice a day  Current Treatment Recommendations: Strengthening, Stair training, ROM, Building services engineerunctional Mobility Training, Patent attorneyafety Education & Training, Pain Management, Investment banker, operationalGait Training, Equities traderatient/Caregiver Education & Training, Mining engineerquipment Evaluation, Education, Garment/textile technologist& procurement  Safety Devices  Safety Devices in place: Yes  Type of devices: Call light within reach, Left in chair (Pt expresses understaning ot need to  call staff to get up from chair and does not wish to use chair alarm at this time.  )      Goals  Short term goals  Time Frame for Short term goals: Discharge  Short term goal 1: Bed mobility with supervision  Short term goal 2: Transfesr with SBA  Short term goal 3: Ambulate 100 ft with crutches and SBA  Short term goal 4: Up and down 4 steps with crutch, rail and SBA  Short term goal 5: Exercises per protocol x 10 reps with min assist       Noah CharonHeidi Roe Wilner, PT  License and Documentation Cosign  Therapy License Number: [id number]

## 2014-01-30 NOTE — Plan of Care (Signed)
Problem: Falls - Risk of  Goal: Absence of falls  Outcome: Ongoing  Pt remains free from injury during this shift. Encourage pt to call for all assistance. Call light is in reach, bed locked and in lowest position. Will continue to monitor and follow POC.    Problem: Pain - Acute:  Goal: Pain level will decrease  Pain level will decrease   Outcome: Ongoing  Pt complained of pain 6/10. Gave 1 percocet. Encourage pt to call if pain increases. Pt currently resting. Will continue to monitor for comfort and follow POC.

## 2014-01-30 NOTE — Discharge Summary (Addendum)
Discharge Summary  Total Knee Arthroplasty    Date:  01/30/2014    Name:  Vernon Chen  [id number]  [place] [id number]    DOB:  1951/03/07      Age:   63 y.o.    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  The patient elected to undergo TKA after discussing the risks, benefits, and alternatives to surgery.  Total knee arthroplasty was performed without complication.  Post op their diet was advanced as tolerated.  Pain was controlled with a combination of IV and PO meds.  DVT prophylaxis was with a combination of SCDs and coumadin which was dosed by pharmacy. On POD #1 the patient participated in physical therapy and made appropriate progress.  After evaluation by the orthopaedic surgeon and the physical therapist, discharge was allowed on POD #2.  The patient was medically stable during hospitalization.    Consults:   Hospitalist/pt/ot     Significant Diagnostic Studies:  Xrays following surgery show a total knee arthroplasty in appropriate position without evidence of fracture or malposition.     Disposition:  home     Meds:  @MEDDISCHARGE @    Patient Instructions:   Activity: activity as tolerated  Diet: regular diet  Wound Care: keep wound clean and dry  DVT Prophylaxis: Coumadin orally each evening the dose which will be determined by pharmacy/clinic.  Coumadin will be continued for a total of 2 weeks with a goal INR of 1.8 to 2.2.    Follow-up with Physical Therapy as currently scheduled    @SIGNATURE @    Name:  [id number] Vernon MillmanJared [name]  Clinical Fellow  [facility]  Date:  01/30/2014

## 2014-01-31 LAB — PROTIME-INR
INR: 1.19 — ABNORMAL HIGH (ref 0.85–1.16)
Protime: 13 s — ABNORMAL HIGH (ref 9.1–12.6)

## 2014-01-31 LAB — CBC
Hematocrit: 38.2 % — ABNORMAL LOW (ref 40.5–52.5)
Hemoglobin: 13.2 g/dL — ABNORMAL LOW (ref 13.5–17.5)
MCH: 32.3 pg (ref 26.0–34.0)
MCHC: 34.5 g/dL (ref 31.0–36.0)
MCV: 93.6 fL (ref 80.0–100.0)
MPV: 8.2 fL (ref 5.0–10.5)
Platelets: 257 10*3/uL (ref 135–450)
RBC: 4.09 M/uL — ABNORMAL LOW (ref 4.20–5.90)
RDW: 13 % (ref 12.4–15.4)
WBC: 11 10*3/uL (ref 4.0–11.0)

## 2014-01-31 MED ADMIN — oxyCODONE-acetaminophen (PERCOCET) 5-325 MG per tablet 2 tablet: 2 | ORAL | @ 01:00:00 | NDC 00406051223

## 2014-01-31 MED ADMIN — polycarbophil (FIBERCON) tablet 625 mg: 625 mg | ORAL | @ 13:00:00 | NDC 24385012576

## 2014-01-31 MED ADMIN — docusate sodium (COLACE) capsule 100 mg: 100 mg | ORAL | @ 13:00:00 | NDC 62584068311

## 2014-01-31 MED ADMIN — oxyCODONE-acetaminophen (PERCOCET) 5-325 MG per tablet 2 tablet: 2 | ORAL | @ 10:00:00 | NDC 00406051223

## 2014-01-31 MED ADMIN — docusate sodium (COLACE) capsule 100 mg: 100 mg | ORAL | @ 01:00:00 | NDC 62584068311

## 2014-01-31 MED ADMIN — vitamin B-12 (CYANOCOBALAMIN) tablet 1,000 mcg: 1000 ug | ORAL | @ 13:00:00 | NDC 00904421713

## 2014-01-31 MED ADMIN — sodium chloride flush 0.9 % injection 10 mL: 10 mL | INTRAVENOUS | @ 01:00:00

## 2014-01-31 MED ADMIN — aspirin EC tablet 81 mg: 81 mg | ORAL | @ 13:00:00 | NDC 63739052210

## 2014-01-31 MED ADMIN — oxyCODONE-acetaminophen (PERCOCET) 5-325 MG per tablet 2 tablet: 2 | ORAL | @ 13:00:00 | NDC 00406051223

## 2014-01-31 MED ADMIN — methylphenidate (RITALIN) tablet 20 mg: 10 mg | ORAL | @ 13:00:00 | NDC 00781574901

## 2014-01-31 MED ADMIN — oxyCODONE HCl ER CR tablet 10 mg: 10 mg | ORAL | @ 01:00:00 | NDC 59011041020

## 2014-01-31 MED ADMIN — oxyCODONE HCl ER CR tablet 10 mg: 10 mg | ORAL | @ 13:00:00 | NDC 59011041020

## 2014-01-31 MED ADMIN — pantoprazole (PROTONIX) tablet 40 mg: 40 mg | ORAL | @ 13:00:00 | NDC 51079005101

## 2014-01-31 MED FILL — OXYCONTIN 10 MG PO T12A: 10 MG | ORAL | Qty: 1

## 2014-01-31 MED FILL — METHYLPHENIDATE HCL 10 MG PO TABS: 10 MG | ORAL | Qty: 2

## 2014-01-31 MED FILL — PERCOCET 5-325 MG PO TABS: 5-325 MG | ORAL | Qty: 2

## 2014-01-31 MED FILL — VITAMIN B-12 1000 MCG PO TABS: 1000 MCG | ORAL | Qty: 1

## 2014-01-31 MED FILL — DOCUSATE SODIUM 100 MG PO CAPS: 100 MG | ORAL | Qty: 1

## 2014-01-31 MED FILL — ASPIRIN EC 81 MG PO TBEC: 81 MG | ORAL | Qty: 1

## 2014-01-31 MED FILL — PROTONIX 40 MG PO TBEC: 40 MG | ORAL | Qty: 1

## 2014-01-31 MED FILL — LOSARTAN POTASSIUM 50 MG PO TABS: 50 MG | ORAL | Qty: 1

## 2014-01-31 NOTE — Progress Notes (Signed)
[facility]  Progress Note  Total Knee Arthroplasty     Date:  01/31/2014    Name:  Vernon Chen  Address:  [address]  [place] [address]    DOB:  [birth year]      Age:   63 y.o.    SSN:  [id number]      Medical Record Number:  [id number]    SUBJECTIVE:    Vernon Chen is a 63 y.o. year old male who is now POD #2  s/p right total knee arthroplasty.  Pain was controlled overnight.  Doing well with PT. There are no new complaints.     OBJECTIVE:  Infusions:  ??? sodium chloride 50 mL/hr at 01/29/14 1539       I/O:I/O last 3 completed shifts:  In: 10 [I.V.:10]  Out: 2025 [Urine:2025]           Wt Readings from Last 1 Encounters:   01/29/14 245 lb (111.131 kg)        LABS:    Recent Labs      01/30/14   0625  01/31/14   0622   WBC  13.0*  11.0   HGB  13.1*  13.2*   HCT  37.4*  38.2*   MCV  92.2  93.6   PLT  267  257        Recent Labs      01/29/14   0715  01/30/14   0625   NA  140  138   K  4.6  4.5   CL  101  103   CO2  31  26   BUN  21*  20   CREATININE  0.8  0.8        Recent Labs      01/29/14   0715   AST  28   ALT  34   BILITOT  0.6   ALKPHOS  49      No results for input(s): LIPASE, AMYLASE in the last 72 hours.     Recent Labs      01/29/14   0715   01/30/14   0625  01/31/14   0622   PROT  7.1   --    --    --    INR  1.01   < >  1.13  1.19*   APTT   --    --   28.4   --     < > = values in this interval not displayed.      No results for input(s): CKTOTAL, CKMB, CKMBINDEX, TROPONINI in the last 72 hours.          Exam:BP 121/77    Pulse 86    Temp(Src) 98.8 ??F (37.1 ??C) (Oral)    Resp 16    Ht 5\' 10"  (1.778 m)    Wt 245 lb (111.131 kg)    BMI 35.15 kg/m2      SpO2 95%    General appearance: alert, appears stated age and cooperative   Lungs: Breathing is unlabored   Heart: regular rate and rhythm   Abdomen: soft, nontender   GI: Positive Flatus    Neuro: Sensation is intact in foot.     Vascular: Toes are well perfused.      Right knee exam: dressing is  clean, dry, and intact. Compartments soft and NT. 5+       DF/PF strength. 2+ palp PT/DP pulses. Calf soft  and nontender with negative  homans. DNVI    ASSESSMENT:   POD #2 s/p right total knee arthroplasty    PLAN:    Analgesia: Pain control with combination of oral and IV analgesics.   Dressings: Change at first postop visit in office   DVT Prophylaxis: Coumadin/Mechanical   PT: Vernon Chen will participate in physical therapy today with emphasis on  ambulation and range of motion.    Disposition: D/C to home when pain controlled, cleared by PT, and medically stable  and cleared by hospitalist.        Vernon Chen                                        Clinical Fellow  [facility]    Date:  01/31/2014    I have personally performed and/or participated in the history, exam and medical decision making and agree with all pertinent clinical information.  I have also reviewed and agree with the past medical, family and social history unless otherwise noted.    Yancey FlemingsFrank R. Army MeliaNoyes, MD

## 2014-01-31 NOTE — Discharge Instructions (Signed)
Good nutrition is important when healing from an illness, injury, or surgery. Follow any nutrition recommendations given to you during the hospital stay. If you are instructed to take an oral nutrition supplement at home, you can take it with meals, in-between meals, and/or before bedtime. These supplements can be purchased at most local grocery stores, pharmacies, and chain super-stores. If you need help in covering the cost of your medication or oral supplement, visit www.RxAssist.org. If you have any questions about your diet or nutrition, call the hospital and ask for the dietitian.       Your nutrition orders at the time of discharge were:  DIET GENERAL;.

## 2014-01-31 NOTE — Progress Notes (Signed)
Patient with orders for discharge. Patient reviewed discharge instructions with patient and wife. Patient and wife verbalized understanding of medications and instructions. Patient questioning need for controled release oxycontin at discharge. Phone call placed and message left with Lupita Leash for Dr. Army Melia or Dr. Cliffton Asters, awaiting return phone call, patient and wife anxious to leave hospital and phone number for patient left with RN to return phone call with information to patient. Information with relayed to patient once RN receives returned call.

## 2014-01-31 NOTE — Progress Notes (Signed)
Physical Therapy Treatment / Discharge  Physical Therapy  Daily Treatment Note    Chart Reviewed: Yes     Other position/activity restrictions: fwbat, high fall risk   Additional Pertinent Hx: HTN, HLD, murmur,  GERD, BBB, sinus disorder,  Left TKR      Pt seen POD # 2 for    [x]  Right [x]  Total Knee Replacement   []  Left  []  Total Hip Replacement    Subjective: Pt ready to be D/Ced. Wife present.     Pain: Sore R knee. Ice packs filled after PT session.    Objective:    Total Treatment time: 30 minutes    Bed mobility  Supine to sit: Supervision, bed flat  Sit to Supine: Supervision, bed flat    Transfers  Sit to stand: SBA from bed (x 4 trials)  Stand to sit: SBA    Ambulation  Assistance Level: SBA  Assistive device: Crutches  Distance: 160 ft  Quality of gait: Steady    Stairs  Up/down 4 steps with crutch/rail CGA    Exercises  10 reps B ankle pumps, quad sets, glut sets, R hip abd/add, SLR, SAQ independently  10 reps R heel slides with mod assist    Knee ROM  12 - 60 degrees AAROM R knee in supine    Patient Education  Continuing HEP at home with assist of wife. Expressed understanding.  Stair negotiation with crutch/rail. Demonstrated good understanding and safely.    Pt left with needs in reach. In bed with wife present.    Assessment:  Moving well. Some difficulty with heel slides--pt feels this will improve when bandage is removed. Good effort.     Discharge Recommendations: Home with assist PRN and continued PT  Equipment Needs: No (pt has crutches)    Goals: (as determined and assessed by primary PT)  Time Frame for Short term goals: Discharge  Short term goal 1: Bed mobility with supervision   Short term goal 2: Transfers with SBA   Short term goal 3: Ambulate 100 ft with crutches and SBA  Short term goal 4: Up and down 4 steps with crutch, rail and SBA  Short term goal 5: Exercises per protocol x 10 reps with min assist     Plan:  Anticipate D/C to hotel x 2 weeks. Wife to assist as needed. Pt to  continue with outpatient PT.Marland Kitchen.    Casas Adobeshristina Bobi Daudelin, [place] #[phone]

## 2014-01-31 NOTE — Plan of Care (Signed)
Problem: Falls - Risk of  Goal: Absence of falls  Outcome: Ongoing  Pt remains free from injury during this shift. Encourage pt to call for assistance when needing to get out of bed. Call light is in reach, bed locked and in lowest position. Will continue to monitor and follow POC.    Problem: Pain - Acute:  Goal: Pain level will decrease  Pain level will decrease   Outcome: Ongoing  Pt complained of pain 7/10. Gave 2 percocet and encouraged pt to call if pain increases. Will continue to monitor and follow POC.

## 2014-01-31 NOTE — Progress Notes (Signed)
Clinical Pharmacy Consult Note    Subjective/Objective:  Pt admitted S/P R TKA,  POD #2.  Consulted by Dr. Cliffton Asters to dose Warfarin for post-op VTE prophylaxis.  Target INR = 1.8-2.2.    PMH significant for HTN and GERD.    Reviewed medications for possible drug-drug interactions with Warfarin.     Lab Results   Component Value Date    WBC 11.0 01/31/2014    HGB 13.2* 01/31/2014    HCT 38.2* 01/31/2014    MCV 93.6 01/31/2014    PLT 257 01/31/2014       Lab Results   Component Value Date    PROTIME 13.0* 01/31/2014    INR 1.19* 01/31/2014     Assessment/Plan:  1. Anticoagulation - VTE prophylaxis s/p R TKA: pharmacy to dose warfarin    ?? Target INR 1.8-2.2  ?? Baseline INR = 1.01 7/22  ?? INR today = 1.19 -- will give warfarin 7.5 mg tonight, then resume 5 mg daily.    ?? If patient discharged today: recommend warfarin 7.5 mg tonight (Fri 7/24), then 5 mg daily. Would have INR checked Monday 7/27.  ?? Daily INR will be monitored and dose adjustments made as needed.    Date INR Warfarin   7/22 1.01 5 mg   7/23 1.13 5 mg   7/24 1.19           Please call with questions.  Dorthula Rue, PharmD, BCPS  Wireless # [phone]  01/31/2014 9:36 AM

## 2014-02-03 LAB — PROTIME-INR
INR: 1 (ref 0.8–1.2)
Protime: 13.2 Seconds (ref 11.7–14.2)

## 2014-02-03 NOTE — Other (Signed)
Physical Therapy Note  Date:  2014-02-21    Patient Name:  Vernon Chen    DOB:  [birth year]  MRN: [id number]  Restrictions/Precautions: Janeal Holmes, MD OOT Pt - will be [place]-town for 2 weeks (daily appointments)   Medical/Treatment Diagnosis Information:   ?? 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  Meds: Percocet, Coumadin, Oxycontin    SEEN BY FELLOW: 02-21-14: update meds, surgery went well  Insurance/Certification information:   [facility]  Physician Information:  Referring Practitioner: Urbano Heir  Plan of care signed (Y/N): yes  Visit# / total visits:  2  Pain level: 5/10 at rest; 7/10 at worst - when exercising and pushing through pain       G-Code (if applicable):      Date / Visit # G-Code Applied: 02-21-14 / Visit #2  PT G-Codes  Functional Assessment Tool Used: LEFS (post-op)  Score: 10/80 = 13%  Functional Limitation: Mobility: Walking and moving around  Mobility: Walking and Moving Around Current Status (V4098): At least 80 percent but less than 100 percent impaired, limited or restricted    Next Progress Note due by: Visit #10           Restrictions:       Subjective: Doing okay this morning. Pain is high today and he is having some trouble sleeping. Able to do some exercises since surgery, doing ankle pumps and quad sets.    Objective:    Functional Scale:  LEFS 33/80 = 41% > 59% impaired (pre-op 01/28/14), 10/80 = 13% > 83% impaired (post-op 2014-02-21)    Observation:  ??? Quad Recruitment: fair-    ??? Effusion: mod visible swelling    ??? Wound Appearance: healing well, minimal drainage on PO dressing, no additional drainage during session; tegaderm/gauze removed, steri strips applied this visit.    ??? Palpation: NT; generalized to knee    ??? Gait: midly antalgic, stiff-legged gait, B/L crutches    ??? Other:     2014/02/21  ROM PROM AROM Overpressure Comment    L R L R L R    Flexion 120 70     EOB   Extension 0 -6                              February 21, 2014  Strength L R Comment   Quad 5/5  NT 2/2 sx   Hamstring 5/5     Gastroc  5/5     Hip flexors 5/5     Hip abduction 5/5       02/21/14  Special Test Results/Comment   Homans Neg   Temp Neg     02/21/14  Girth L R   Mid Patella 46.0 47.8   Suprapatellar 47.8 48.7   5cm above 49.5 50.8   15cm above       Exercises:  Exercise/Equipment Resistance/Repetitions Other comments   Stretching     Hamstring 5 x 30"    Towel Pull 5 x 30"    Inclined Calf     Hip Flexion     ITB     Groin     Quad          SLR     Supine 4 x 5 MinA on first set   Abduction     Adduction 2 x 10 x 10" PS   Prone     SLR+  Isometrics     Quad sets 2 x 10 x 10" A/S        Patellar Glides     Medial     Superior     Inferior          ROM     Sheet Pulls     Hang Weights     Passive 15 x 10" EOB w/ well leg   Active     Weight Shift     Ankle Pumps 100x         CKC     Calf raises     Wall sits     Step ups     1 leg stand     Squatting     CC TKE     Balance          PRE     Extension  RANGE:   Flexion  RANGE:   Leg Press  RANGE:        Other     Gait training 5' WBAT, B/L crutches; VCs                 Other Therapeutic Activities:  Removed PO dressing, tegaderm and gauze, applied steri strips and TEDs    Home Exercise Program:   Initial PO exercises. Written and verbal instructions were provided to patient. Updated 02/03/14.    Patient Education:      Reviewed full post-op instructions    Manual Treatments:     Modalities:    [] hot packs  [] EMS   [] Ultrasound  [] ice   [x] vasopneumatic         [x] high volt/EGS  [] phono  [] tens    [] ionto  [] autorange/biodex [] Interferential  [] other    Timed Code Treatment Minutes: 35'    Total Treatment Minutes: 30'    Assessment:    [x] Patient tolerated treatment well [] Patient limited by fatigue  [] Patient limited by pain  [] Patient limited by other medical complications  [x] Other: Pt did well this visit with mild increase in pain with quad sets, SLRs and PROM. Symptoms are all consistent with surgical procedure. Needed minA with first set of SLRs, then pt was able  to do them without assistance.      Plan:       ?    [x] Continue per plan of care [] Alter current plan (see comments)  [] Plan of care initiated [] Hold pending MD visit [] Discharge    Rehab Potential: [] Excellent [x] Good [] Fair  [] Poor    Frequency/Duration: daily while in town  # Days per week: [] 1 day # Weeks: [] 1 week [] 5 weeks     [] 2 days?   [] 2 weeks [] 6 weeks     [] 3 days   [] 3 weeks [] 7 weeks     [x] 4 days   [] 4 weeks [x] 8 weeks      Plan for next session: Progress per protocol and pt tolerance. Make OOT packet prior to pt heading back home.    MD follow up: 1/09/14/10 weeks PO (02/11/14)    Goals  Short term goals  Time Frame for Short term goals: 1 week  Short term goal 1: Pt will be independent with HEP    MET  Short term goal 2: Pt will have proper PWB gait  mechanics with B/L crutches   MET  Short term goal 3: Pt will demonstrate understanding of PO instructions   MET  Long term goals  Time Frame for Long term goals : 4-6 weeks  Long term goal 1: Pt will have 0-115 degrees of knee PROM  Long term goal 2: Pt will be able to ambulate with proper FWB mechanics without AD  Long term goal 3: Pt will have at least 9% improvement from post-op LEFS       Electronically Signed By:  Ambrose Pancoast, PT

## 2014-02-04 NOTE — Other (Signed)
Physical Therapy Note  Date:  02/04/2014    Patient Name:  Vernon Chen    DOB:  [birth year]  MRN: [id number]  Restrictions/Precautions: Janeal Holmes, MD OOT Pt - will be [place]-town for 2 weeks (daily appointments)   Medical/Treatment Diagnosis Information:   ?? 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  Meds: Percocet, Coumadin, Oxycontin (prescribed but has not taken yet)    SEEN BY FELLOW: 02-03-14: update meds, surgery went well  Insurance/Certification information:   [facility]  Physician Information:  Referring Practitioner: Urbano Heir  Plan of care signed (Y/N): yes  Visit# / total visits:  2  Pain level: 4/10 at rest; 6/10 at worst      G-Code (if applicable):      Date / Visit # G-Code Applied: 02/03/14 / Visit #2       Next Progress Note due by: Visit #10           Restrictions:       Subjective: Feeling a little bit better today. Getting knee moving and PO bandage off made knee feel better. Has not used any of oxycontin yet, just using Percocet.    Objective:    Functional Scale:  LEFS 33/80 = 41% > 59% impaired (pre-op 01/28/14), 10/80 = 13% > 83% impaired (post-op 02/03/14)    Observation:  ??? Quad Recruitment: fair-    ??? Effusion: mod visible swelling    ??? Wound Appearance: healing well, no drainage since yesterday, no signs of infection, steri strips in place    ??? Palpation: NT; generalized to knee    ??? Gait: midly antalgic, stiff-legged gait, B/L crutches    ??? Other:     02/04/14  ROM PROM AROM Overpressure Comment    L R L R L R    Flexion 120 71     EOB   Extension 0 -5                              02/03/14  Strength L R Comment   Quad 5/5  NT 2/2 sx   Hamstring 5/5     Gastroc 5/5     Hip flexors 5/5     Hip abduction 5/5       02/03/14  Special Test Results/Comment   Homans Neg   Temp Neg     02/03/14  Girth L R   Mid Patella 46.0 47.8   Suprapatellar 47.8 48.7   5cm above 49.5 50.8   15cm above       Exercises:  Exercise/Equipment Resistance/Repetitions Other comments   Stretching     Hamstring 5 x 30"    Towel Pull  5 x 30"    Inclined Calf     Hip Flexion     ITB     Groin     Quad          SLR     Supine 3 x 10 NoA   Abduction     Adduction 2 x 10 x 10" PS   Prone     SLR+          Isometrics     Quad sets 2 x 10 x 10" A/S        Patellar Glides     Medial 2'    Superior 2'    Inferior 2'         ROM     Sheet Pulls  Hang Weights     Passive 15 x 10" EOB w/ well leg   Active     Weight Shift     Ankle Pumps 100x    ERMI  NPV        CKC     Calf raises 3 x 10    Wall sits     Step ups     1 leg stand     Squatting     CC TKE     Balance          PRE     Extension  RANGE:   Flexion  RANGE:   Leg Press  RANGE:        Other     Gait training               Other Therapeutic Activities:    Home Exercise Program:   Initial PO exercises. Written and verbal instructions were provided to patient. Updated 02/03/14.    Patient Education:   Working on flexion 3-6x/day    Manual Treatments: patellar mobilization    Modalities:    []  hot packs  []  EMS   []  Ultrasound  []  ice   [x]  vasopneumatic         [x]  high volt/EGS  []  phono  []  tens    []  ionto  []  autorange/biodex []  Interferential  []  other    Timed Code Treatment Minutes: 31'    Total Treatment Minutes: 51'    Assessment:    []  Patient tolerated treatment well [x]  Patient limited by fatigue  [x]  Patient limited by pain  []  Patient limited by other medical complications  [x]  Other: Pt had improved quad activation with quad sets and SLRs this visit. Did not need any assistance with SLRs. Has improved extension, however pt did not make much improvement in flexion ROM.      Plan:       ?    [x]  Continue per plan of care []  Alter current plan (see comments)  []  Plan of care initiated []  Hold pending MD visit []  Discharge    Rehab Potential: []  Excellent [x]  Good []  Fair  []  Poor    Frequency/Duration: daily while in town  # Days per week: []  1 day # Weeks: []  1 week []  5 weeks     []  2 days?   []  2 weeks []  6 weeks     []  3 days   []  3 weeks []  7 weeks     [x]  4 days   []  4 weeks [x]   8 weeks      Plan for next session: Progress per protocol and pt tolerance. Make OOT packet prior to pt heading back home.    MD follow up: 1/09/14/10 weeks PO (02/11/14)    Goals  Short term goals  Time Frame for Short term goals: 1 week  Short term goal 1: Pt will be independent with HEP    MET  Short term goal 2: Pt will have proper PWB gait mechanics with B/L crutches   MET  Short term goal 3: Pt will demonstrate understanding of PO instructions   MET  Long term goals  Time Frame for Long term goals : 4-6 weeks  Long term goal 1: Pt will have 0-115 degrees of knee PROM  Long term goal 2: Pt will be able to ambulate with proper FWB mechanics without AD  Long term goal 3: Pt will have at least 9% improvement  from post-op LEFS       Electronically Signed By:  Ambrose Pancoast, PT

## 2014-02-05 NOTE — Other (Signed)
Physical Therapy Note  Date:  02/05/2014    Patient Name:  Vernon Chen    DOB:  August 25, 1950  MRN: [id number]  Restrictions/Precautions: Janeal Holmes, MD OOT Pt - will be [place]-town for 2 weeks (daily appointments)   Medical/Treatment Diagnosis Information:   ?? 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  Meds: Percocet, Coumadin, Oxycontin (prescribed but has not taken yet)    SEEN BY FELLOW: 02-03-14: update meds, surgery went well  Insurance/Certification information:   [facility]  Physician Information:  Referring Practitioner: Urbano Heir  Plan of care signed (Y/N): yes  Visit# / total visits:  3  Pain level: 4/10 at rest; 5/10 at worst      G-Code (if applicable):      Date / Visit # G-Code Applied: 02/03/14 / Visit #2       Next Progress Note due by: Visit #10           Restrictions:       Subjective: Pain is a little bit better today. Feeling dizzy this morning, does not think he ate enough for breakfast prior to taking pain meds. Was a little sore after yesterday's visit but believes he had pushed himself a little harder than before.    Objective:    Functional Scale:  LEFS 33/80 = 41% > 59% impaired (pre-op 01/28/14), 10/80 = 13% > 83% impaired (post-op 02/03/14)    Observation:  ??? Quad Recruitment: fair-    ??? Effusion: mod visible swelling    ??? Wound Appearance: healing well, no drainage, no signs of infection, steri strips in place    ??? Palpation: NT; generalized to knee    ??? Gait: midly antalgic, stiff-legged gait, no AD around clinic    ??? Other:     02/05/14  ROM PROM AROM Overpressure Comment    L R L R L R    Flexion 120 80     ERMI   Extension 0 -3     Prop                          02/03/14  Strength L R Comment   Quad 5/5  NT 2/2 sx   Hamstring 5/5     Gastroc 5/5     Hip flexors 5/5     Hip abduction 5/5       02/03/14  Special Test Results/Comment   Homans Neg   Temp Neg     02/03/14  Girth L R   Mid Patella 46.0 47.8   Suprapatellar 47.8 48.7   5cm above 49.5 50.8   15cm above       Exercises:  Exercise/Equipment  Resistance/Repetitions Other comments   Stretching     Hamstring 5 x 30"    Towel Pull 5 x 30"    Inclined Calf     Hip Flexion     ITB     Groin     Quad          SLR     Supine 3 x 10 Quad lag   Abduction 3 x 10    Adduction 2 x 10 x 10" PS   Prone     SLR+          Isometrics     Quad sets 2 x 10 x 10" A/S        Patellar Glides     Medial 2'    Superior 2'  Inferior 2'         ROM     Sheet Pulls     Hang Weights     Passive Active 10x  EOB   Weight Shift     Ankle Pumps     ERMI 5 x 30'         CKC     Calf raises 3 x 10    Wall sits     Step ups     1 leg stand     Squatting     CC TKE     Balance          PRE     Extension  RANGE:   Flexion  RANGE:   Leg Press  RANGE:        Other     Gait training               Other Therapeutic Activities:    Home Exercise Program:   Initial PO exercises. Written and verbal instructions were provided to patient. Updated 02/03/14.    Patient Education:   Working on flexion 3-6x/day    Manual Treatments: patellar mobilization    Modalities:    _0  hot packs  _1  EMS   _2  Ultrasound  _3  ice   _4  vasopneumatic         _5  high volt/EGS  _6  phono  _7  tens    _8  ionto  _9  autorange/biodex _10  Interferential  _11  other    Timed Code Treatment Minutes: 80'    Total Treatment Minutes: 65'    Assessment:    _12  Patient tolerated treatment well _13  Patient limited by fatigue  _14  Patient limited by pain  _15  Patient limited by other medical complications  <[id number]>_4<\/[id number]>_35  Other: Pt making steady improvement in ROM and muscle activation. Walked in clinic without AD with stiff-legged gait although knee was stable. Pt was dizzy upon arrival, given water, pretzels and cool towel and laid flat for about 5 minutes. Pt felt better after that and did not have dizziness for the rest of the session.     Plan:       ?    _17  Continue per plan of care _18  Alter current plan (see comments)  _19  Plan of care initiated _20  Hold pending MD visit _21  Discharge    Rehab Potential: _22  Excellent _23  Good _24  Fair  _25   Poor    Frequency/Duration: daily while in town  # Days per week: _26  1 day # Weeks: _27  1 week _28  5 weeks     _29  2 days?   _30  2 weeks _31  6 weeks     _32  3 days   _33  3 weeks _34  7 weeks     _35  4 days   _36  4 weeks _37  8 weeks      Plan for next session: Progress per protocol and pt tolerance. Make OOT packet prior to pt heading back home.    MD follow up: 1/09/14/10 weeks PO (02/11/14)    Goals  Short term goals  Time Frame for Short term goals: 1 week  Short term goal 1: Pt will be independent with HEP    MET  Short term goal 2: Pt will have proper PWB gait mechanics with B/L crutches   MET  Short term goal 3: Pt will demonstrate understanding of PO instructions   MET  Long term goals  Time Frame for Long term goals : 4-6 weeks  Long term goal 1:  Pt will have 0-115 degrees of knee PROM  Long term goal 2: Pt will be able to ambulate with proper FWB mechanics without AD  Long term goal 3: Pt will have at least 9% improvement from post-op LEFS       Electronically Signed By:  Ambrose Pancoast, PT

## 2014-02-05 NOTE — Progress Notes (Signed)
Depo Medrol     NDC #: [id number]   Lot #:  [id number]  Exp Date: 09/2016    Ropivacaine    NCD#:  [id number]  LOT#:  [id number]  Exp Date:  02/19    Rt Shoulder

## 2014-02-05 NOTE — Progress Notes (Signed)
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 Army Melia who is here today for evaluation of his right shoulder.  He's had right shoulder pain off and on for the past 2 years.  He describes.  About a year and a half ago after his left total knee replacement in which she was doing excessive bench pressing with a personal trainer.  He did so much bench pressing that he started having sharp pains in the top of his shoulder he was unable to bench anymore.  He describes pain with wide grip and difficulty sleeping at night while lying on his left side.  He denies any radiculopathy, myelopathy, numbness and tingling, fevers or chills or any recent injury.  He said no treatment to date on his right shoulder.    History of Present Illness:  Vernon Chen is a 63 y.o. male  Location of Pain: Shoulder  Location Modifiers: Right  Severity of Pain: 6  Quality of Pain: Sharp  Duration of Pain:  (only when lifting)  Frequency of Pain: Intermittent  Aggravating Factors: Exercise  Limiting Behavior: No  Relieving Factors: Rest  Result of Injury: Yes  Work-Related Injury: No    Medical History:  Patient's medications, allergies, past medical, surgical, social and family histories were reviewed and updated as appropriate.    Review of Systems:  Relevant review of systems reviewed and available in the patient's chart    Vital Signs:  Filed Vitals:    02/05/14 1518   BP: 135/83   Pulse: 85       General Exam:   Constitutional: Patient is adequately groomed with no evidence of malnutrition  Mental Status: The patient is oriented to time, place and person.  The patient's mood and affect are appropriate.  Neurological: The patient has good coordination.  There is no weakness or sensory deficit.    Shoulder Examination:    Inspection:  Skin is intact, no gross deformity erythema ecchymosis or  edema.  No atrophy.  No Popeye deformity.    Skin: There are no rashes, ulcerations or lesions.    Palpation:  Tender to palpation over the a.c. joint and mild tennis palpation over the greater tuberosity.  Nontender to palpation over the biceps tendon SC joint    Range of Motion:  Full active and passive range of motion without limitation.  Note there is gross crepitus in the subacromial space with circles.    Strength:  5 out of 5 rotator cuff strength testing.  Mild weakness in external rotation.  Negative belly press negative Bear hug.    Sensation: Intact light touch throughout distally including axillary    Special Tests:  Positive crossarm abduction-this reproduces pain.  Mildly positive Hawkins and Neer.  Negative speed and negative O'Brien.    Additional Examinations:         Neck: Examination of the neck does not show any tenderness, deformity or injury.  Range of motion is unremarkable.  There is no gross instability.  There are no rashes, ulcerations or lesions.  Strength and tone are normal.    Radiology:     X-rays obtained and reviewed in office:  Views 3 views: AP and axillary lateral scapular Y  Location right shoulder  Impression : No acute fracture dislocation of bony lesion noted.  There is moderate to severe acromioclavicular joint space narrowing with  sclerosis and osteophyte formation.      Assessment :  Right shoulder a.c. joint osteoarthritis    Impression:    Right shoulder acromioclavicular joint osteoarthritis with associated mild rotator cuff tendinitis    Encounter Diagnoses   Name Primary?   ??? Osteoarthritis of right acromioclavicular joint Yes   ??? Right rotator cuff tendinitis        Office Procedures:  Orders Placed This Encounter   Procedures   ??? Amb External Referral To Physical Therapy     Referral Priority:  Routine     Referral Type:  Consult for Advice and Opinion     Referral Reason:  Patient Preference     Requested Specialty:  Physical Therapy     Number of Visits Requested:   1       Treatment Plan:  I had a long discussion with the patient regarding diagnosis and treatment plan of which greater than 50% time was spent counseling.  At this point recommend conservative management to include physical therapy to work on rotator cuff and periscapular strengthening.  A prescription for physical therapy was provided today for hematoma physical therapy to do along with his knee/total knee arthroplasty protocol while at home in [place].  Because he has had difficulty sleeping on his left shoulder were provided today a corticosteroid injection into the right acromial clavicular joint and subacromial space.  The follow-up as needed when following up for his total knee with Dr. Army Melia.    Patient expressed understanding and all questions were answered.  Patient agrees with the plan and will follow-up as instructed and or sooner as needed.              [facility]  Procedure Note  Subacromial And Acromioclavicular Injection    Date:  02/05/2014 at 4:25 PM     Name:  Vernon Chen  Address:  [address]  [place] [id number]    DOB:  08-May-1951      Age:   63 y.o.    SSN:  [id number]      Medical Record Number:  [id number]    Date of Procedure:  02/05/2014    Pre-Procedure Diagnosis: right Shoulder Impingement and AC arthritis  Post-Procedure Diagnosis: Same  Procedure: right Subacromial and acromioclavicular steroid injection  Surgeon:  Caffie Pinto, MD  Anesthesia:  Local  EBL: 0 ml  Complications: None  Condition: Stable  Disposition: Stable to home    Procedure: Following a discussion of the potential benefits and risks of steroid injections the patient has consented to receive this subacromial and acromioclavicular steroid injection.  The superior and posterior aspect of the right shoulder was sterilely prepared with alcohol and injected with 2 mL's of 40 mg of depo-medrol combined with 4 ml of 0.5% Ropivacaine divided between the spaces.  Sterile adhesive  dressings were applied to the injection sites.  The patient tollerated the procedure well.  There were no complications and no blood loss.    Vernon Chen  Clinical Fellow  [facility]  Date:  02/05/2014

## 2014-02-06 LAB — PROTIME-INR
INR: 1 (ref 0.8–1.2)
Protime: 13.2 Seconds (ref 11.7–14.2)

## 2014-02-06 NOTE — Other (Signed)
Physical Therapy Note  Date:  02/06/2014    Patient Name:  Vernon Chen    DOB:  Apr 19, 1951  MRN: [id number]  Restrictions/Precautions: Janeal Holmes, MD OOT Pt - will [place]-town for 2 weeks (daily appointments)   Medical/Treatment Diagnosis Information:   ?? 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  Meds: Percocet, Coumadin, Oxycontin (prescribed but has not taken yet)    SEEN BY FELLOW: 02-03-14: update meds, surgery went well  Insurance/Certification information:   [facility]  Physician Information:  Referring Practitioner: Urbano Heir  Plan of care signed (Y/N): yes  Visit# / total visits:  4  Pain level: 6/10 at rest; 7-8/10 at worst      G-Code (if applicable):      Date / Visit # G-Code Applied: 02/03/14 / Visit #2       Next Progress Note due by: Visit #10           Restrictions:       Subjective: Pain is a bit higher last night and the patient states that he had more swelling last night.  He thinks it was a combination of the coumadin and the bending on the ermi.  Patient has a Venous doppler scan on Monday scheduled.    Objective:    Functional Scale:  LEFS 33/80 = 41% > 59% impaired (pre-op 01/28/14), 10/80 = 13% > 83% impaired (post-op 02/03/14)    Observation:  ??? Quad Recruitment: fair-    ??? Effusion: mod visible swelling    ??? Wound Appearance: healing well, no drainage, no signs of infection, steri strips in place    ??? Palpation: NT; generalized to knee    ??? Gait: midly antalgic, stiff-legged gait, no AD around clinic    ??? Other:     02/06/14  ROM PROM AROM Overpressure Comment    L R L R L R    Flexion 120 84     ERMI   Extension 0 -3     Prop                          02/03/14  Strength L R Comment   Quad 5/5  NT 2/2 sx   Hamstring 5/5     Gastroc 5/5     Hip flexors 5/5     Hip abduction 5/5       02/03/14  Special Test Results/Comment   Homans Neg   Temp Neg     02/03/14  Girth L R   Mid Patella 46.0 47.8   Suprapatellar 47.8 48.7   5cm above 49.5 50.8   15cm above       Exercises:  Exercise/Equipment  Resistance/Repetitions Other comments   Stretching     Hamstring 5 x 30"    Towel Pull 5 x 30"    Inclined Calf     Hip Flexion     ITB     Groin     Quad          SLR     Supine 3 x 10 Quad lag   Abduction 3 x 10    Adduction 2 x 10 x 10" PS   Prone     SLR+          Isometrics     Quad sets 2 x 10 x 10" A/S        Patellar Glides     Medial 3    Superior  3    Inferior 3         ROM     Sheet Pulls     Hang Weights     Passive 15 x 10" EOB w/ well leg   Active 10x  EOB   Weight Shift 3x10    Ankle Pumps 100x    ERMI 5 x 30'         CKC     Calf raises 3 x 10    Wall sits     Step ups     1 leg stand     Squatting 3x10 mini   CC TKE     Balance          PRE     Extension  RANGE:   Flexion  RANGE:   Leg Press  RANGE:        Other     Gait training               Other Therapeutic Activities:    Home Exercise Program:   Initial PO exercises. Written and verbal instructions were provided to patient. Updated 02/03/14.    Patient Education:   Working on flexion 3-6x/day    Manual Treatments:   patellar mobilization    Modalities:    []  hot packs  []  EMS   []  Ultrasound  []  ice   [x]  vasopneumatic         [x]  high volt/EGS  []  phono  []  tens    []  ionto  []  autorange/biodex []  Interferential  []  other    Timed Code Treatment Minutes: 52'  Man x 9'  TE x35  NM x 15'      Total Treatment Minutes: 79'    Assessment:    []  Patient tolerated treatment well [x]  Patient limited by fatigue  [x]  Patient limited by pain  []  Patient limited by other medical complications  [x]  Other: Pt. Has some increased stiffness and pain last night but is able to press the leg and make gains in motion.  Is barely using the walker, which may lead to increased tisseu irritation due to pre-mature tissue loading. Continue to educate and work motion.     Plan:       ?    [x]  Continue per plan of care []  Alter current plan (see comments)  []  Plan of care initiated []  Hold pending MD visit []  Discharge    Rehab Potential: []  Excellent [x]  Good []  Fair  []   Poor    Frequency/Duration: daily while in town  # Days per week: []  1 day # Weeks: []  1 week []  5 weeks     []  2 days?   []  2 weeks []  6 weeks     []  3 days   []  3 weeks []  7 weeks     [x]  4 days   []  4 weeks [x]  8 weeks      Plan for next session: Progress per protocol and pt tolerance. Make OOT packet prior to pt heading back home.  [facility] venous doppler scan    MD follow up: 1/09/14/10 weeks PO (02/11/14)    Goals  Short term goals  Time Frame for Short term goals: 1 week  Short term goal 1: Pt will be independent with HEP    MET  Short term goal 2: Pt will have proper PWB gait mechanics with B/L crutches   MET  Short term goal 3: Pt will demonstrate understanding of PO  instructions   MET  Long term goals  Time Frame for Long term goals : 4-6 weeks  Long term goal 1: Pt will have 0-115 degrees of knee PROM  Long term goal 2: Pt will be able to ambulate with proper FWB mechanics without AD  Long term goal 3: Pt will have at least 9% improvement from post-op LEFS       Electronically Signed By:  Reuel Boom, PT

## 2014-02-07 NOTE — Other (Signed)
Physical Therapy Note  Date:  02/07/2014    Patient Name:  Vernon Chen    DOB:  1951-02-12  MRN: [id number]  Restrictions/Precautions: Janeal Holmes, MD OOT Pt - will be in-town for 2 weeks (daily appointments)   Medical/Treatment Diagnosis Information:   ?? 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  Meds: Percocet, Coumadin, Oxycontin (prescribed but has not taken yet)    SEEN BY FELLOW: 02-03-14: update meds, surgery went well  Insurance/Certification information:   [facility]  Physician Information:  Referring Practitioner: Urbano Heir  Plan of care signed (Y/N): yes  Visit# / total visits:  5  Pain level: 4/10 at rest; 5/10 at worst      G-Code (if applicable):      Date / Visit # G-Code Applied: 02/03/14 / Visit #2       Next Progress Note due by: Visit #10           Restrictions:       Subjective: Doing better so far today. Pain reducing consistently.    Objective:    Functional Scale:  LEFS 33/80 = 41% > 59% impaired (pre-op 01/28/14), 10/80 = 13% > 83% impaired (post-op 02/03/14)    Observation:  ??? Quad Recruitment: fair-    ??? Effusion: mod visible swelling    ??? Wound Appearance: healing well, no drainage, no signs of infection, steri strips in place    ??? Palpation: NT; generalized to knee    ??? Gait: midly antalgic, stiff-legged gait, no AD around clinic    ??? Other:     02/07/14  ROM PROM AROM Overpressure Comment    L R L R L R    Flexion 120 86     ERMI   Extension 0 -3     Prop                          02/03/14  Strength L R Comment   Quad 5/5  NT 2/2 sx   Hamstring 5/5     Gastroc 5/5     Hip flexors 5/5     Hip abduction 5/5       02/03/14  Special Test Results/Comment   Homans Neg   Temp Neg     02/03/14  Girth L R   Mid Patella 46.0 47.8   Suprapatellar 47.8 48.7   5cm above 49.5 50.8   15cm above       Exercises:  Exercise/Equipment Resistance/Repetitions Other comments   Stretching     Hamstring 5 x 30"    Towel Pull 5 x 30"    Inclined Calf     Hip Flexion     ITB     Groin     Quad  NPV        SLR     Supine 3 x  10 Quad lag   Abduction 3 x 10    Adduction 2 x 10 x 10" PS   Prone     SLR+          Isometrics     Quad sets 2 x 10 x 10" A/S        Patellar Glides     Medial 3    Superior 3    Inferior 3         ROM     Sheet Pulls     Hang Weights     Passive 10 x 10" EOB  w/ well leg   Active 10x  EOB   Weight Shift     Ankle Pumps     ERMI 5 x 30'         CKC     Calf raises 3 x 10    Wall sits     Step ups     1 leg stand     Squatting 3x10 mini   CC TKE     Balance          PRE     Extension 3 x 10 RANGE: SAQ, 0#   Flexion 3 x 10 RANGE: Avail., 0#   Leg Press  RANGE:        Other     Gait training               Other Therapeutic Activities:    Home Exercise Program:   Initial PO exercises. Written and verbal instructions were provided to patient. Updated 02/03/14.    Patient Education:   Working on flexion 3-6x/day    Manual Treatments:   patellar mobilization    Modalities:    '[]'$  hot packs  $Remo'[]'UUtFX$  EMS   '[]'$  Ultrasound  $RemoveBef'[]'NYuUTgZTrt$  ice   '[x]'$  vasopneumatic         '[x]'$  high volt/EGS  $RemoveB'[]'jCrAjeMU$  phono  $Remo'[]'dUjdm$  tens    '[]'$  ionto  $Remo'[]'nLFXZ$  autorange/biodex $RemoveBeforeDEI'[]'GUebqvzkRQYJqydk$  Interferential  $RemoveBeforeD'[]'DcSsXDeWzDufnM$  other    Timed Code Treatment Minutes: 20'    Total Treatment Minutes: 51'    Assessment:    '[]'$  Patient tolerated treatment well $RemoveBefore'[x]'AyxlGUVLtEnhv$  Patient limited by fatigue  $Remove'[x]'djIDIKA$  Patient limited by pain  $Rem'[]'cGni$  Patient limited by other medical complications  $RemoveBefore'[x]'bMqpoOiZddUXs$  Other: Pt did well this visit. Small gain in flexion ROM this visit. Progressing well.     Plan:       ?    '[x]'$  Continue per plan of care $Remov'[]'hKNrMH$  Alter current plan (see comments)  $RemoveBe'[]'aaButqrlb$  Plan of care initiated $RemoveBefore'[]'tUpRypmrIPmqF$  Hold pending MD visit $Remove'[]'eEXxloJ$  Discharge    Rehab Potential: $RemoveBeforeDE'[]'XCKXjbFaSykVKEH$  Excellent $RemoveBe'[x]'UfMNPdkHR$  Good $Rem'[]'kQGG$  Fair  $Rem'[]'OuHN$  Poor    Frequency/Duration: daily while in town  # Days per week: $RemoveB'[]'vIIqDjgA$  1 day # Weeks: $Remove'[]'KTcvKWj$  1 week $Remo'[]'weWdM$  5 weeks     '[]'$  2 days?   '[]'$  2 weeks $Remov'[]'ObYdhL$  6 weeks     '[]'$  3 days   '[]'$  3 weeks $Remov'[]'XcfskQ$  7 weeks     '[x]'$  4 days   '[]'$  4 weeks $Remov'[x]'nQZDKX$  8 weeks      Plan for next session: Progress per protocol and pt tolerance. Make OOT packet prior to pt heading back home.  [facility] venous doppler  scan Monday after PT.    MD follow up: 1/09/14/10 weeks PO (02/11/14)    Goals  Short term goals  Time Frame for Short term goals: 1 week  Short term goal 1: Pt will be independent with HEP    MET  Short term goal 2: Pt will have proper PWB gait mechanics with B/L crutches   MET  Short term goal 3: Pt will demonstrate understanding of PO instructions   MET  Long term goals  Time Frame for Long term goals : 4-6 weeks  Long term goal 1: Pt will have 0-115 degrees of knee PROM  Long term goal 2: Pt will be able to ambulate with proper FWB mechanics without AD  Long term goal 3: Pt will have at least 9% improvement from post-op LEFS  Electronically Signed By:  Ambrose Pancoast, PT

## 2014-02-10 NOTE — Other (Signed)
Physical Therapy Note  Date:  02/10/2014    Patient Name:  Vernon Chen    DOB:  [birth year]  MRN: [id number]  Restrictions/Precautions: Janeal Holmes, MD OOT Pt - will be [place]-town for 2 weeks (daily appointments)   Medical/Treatment Diagnosis Information:   ?? 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  Meds: Percocet, Coumadin,  (prescribed but has not taken yet)    SEEN BY FELLOW: 02-03-14: update meds, surgery went well    Insurance/Certification information:   [facility]  Physician Information:  Referring Practitioner: Urbano Heir  Plan of care signed (Y/N): yes  Visit# / total visits:  6  Pain level: 4/10 at rest; 5/10 at worst      G-Code (if applicable):      Date / Visit # G-Code Applied: 02/03/14 / Visit #2       Next Progress Note due by: Visit #10           Restrictions:       Subjective: Feeling better. Was able to sleep through the night during the last 2 nights without waking up to take pain meds. Did not do any exercise other than ankle pumps all weekend. Has venous doppler following PT appointment today.    Objective:    Functional Scale:  LEFS 33/80 = 41% > 59% impaired (pre-op 01/28/14), 10/80 = 13% > 83% impaired (post-op 02/03/14)    Observation:  ??? Quad Recruitment: fair-    ??? Effusion: mod visible swelling    ??? Wound Appearance: healing well, no drainage, no signs of infection, steri strips in place    ??? Palpation: NT; generalized to knee    ??? Gait: midly antalgic, stiff-legged gait, no AD around clinic    ??? Other:     02/10/14  ROM PROM AROM Overpressure Comment    L R L R L R    Flexion 120 92     ERMI   Extension 0 -3     Prop                          02/03/14  Strength L R Comment   Quad 5/5  NT 2/2 sx   Hamstring 5/5     Gastroc 5/5     Hip flexors 5/5     Hip abduction 5/5       02/03/14  Special Test Results/Comment   Homans Neg   Temp Neg     02/03/14  Girth L R   Mid Patella 46.0 47.8   Suprapatellar 47.8 48.7   5cm above 49.5 50.8   15cm above       Exercises:  Exercise/Equipment Resistance/Repetitions  Other comments   Stretching     Hamstring 5 x 30"    Towel Pull 5 x 30"    Inclined Calf     Hip Flexion     ITB     Groin     Quad 5 x 30" Manual        SLR     Supine 3 x 10 Quad lag   Abduction 3 x 10    Adduction 2 x 10 x 10" PS   Prone     SLR+          Isometrics     Quad sets 2 x 10 x 10" A/S        Patellar Glides     Medial 3    Superior 3  Inferior 3         ROM     Sheet Pulls     Hang Weights     Passive 10 x 10" EOB w/ well leg   Active 10x  EOB   Weight Shift     Ankle Pumps     ERMI 5 x 30'         CKC     Calf raises 3 x 10    Wall sits     Step ups     1 leg stand     Squatting 3x10 mini   CC TKE     Balance          PRE     Extension 3 x 10 RANGE: SAQ, 0#   Flexion 3 x 10 RANGE: Avail., 0#   Leg Press  RANGE:        Other     Gait training               Other Therapeutic Activities:    Home Exercise Program:   Initial PO exercises. Written and verbal instructions were provided to patient. Updated 02/03/14.    Patient Education:   Working on flexion 3-6x/day    Manual Treatments:   patellar mobilization    Modalities:    []  hot packs  []  EMS   []  Ultrasound  []  ice   [x]  vasopneumatic         [x]  high volt/EGS  []  phono  []  tens    []  ionto  []  autorange/biodex []  Interferential  []  other    Timed Code Treatment Minutes: 79'    Total Treatment Minutes: 67'    Assessment:    []  Patient tolerated treatment well [x]  Patient limited by fatigue  [x]  Patient limited by pain  []  Patient limited by other medical complications  [x]  Other: Pt progressing well. Encouraged pt to do exercises outside of PT.     Plan:       ?    [x]  Continue per plan of care []  Alter current plan (see comments)  []  Plan of care initiated []  Hold pending MD visit []  Discharge    Rehab Potential: []  Excellent [x]  Good []  Fair  []  Poor    Frequency/Duration: daily while in town  # Days per week: []  1 day # Weeks: []  1 week []  5 weeks     []  2 days?   []  2 weeks []  6 weeks     []  3 days   []  3 weeks []  7 weeks     [x]  4 days   []  4  weeks [x]  8 weeks      Plan for next session: Progress per protocol and pt tolerance. Make OOT packet prior to pt heading back home.    MD follow up: 1/09/14/10 weeks PO (02/11/14)    Goals  Short term goals  Time Frame for Short term goals: 1 week  Short term goal 1: Pt will be independent with HEP    MET  Short term goal 2: Pt will have proper PWB gait mechanics with B/L crutches   MET  Short term goal 3: Pt will demonstrate understanding of PO instructions   MET  Long term goals  Time Frame for Long term goals : 4-6 weeks  Long term goal 1: Pt will have 0-115 degrees of knee PROM  Long term goal 2: Pt will be able to ambulate with proper FWB mechanics without AD  Long term goal 3: Pt will have at least 9% improvement from post-op LEFS       Electronically Signed By:  Ambrose Pancoast, PT

## 2014-02-11 ENCOUNTER — Encounter

## 2014-02-11 NOTE — Other (Signed)
Physical Therapy Note  Date:  2014-03-08    Patient Name:  Vernon Chen    DOB:  Mar 24, 1951  MRN: [id number]  Restrictions/Precautions: Janeal Holmes, MD OOT Pt - [place] 2 [place] (daily appointments)   Medical/Treatment Diagnosis Information:   ?? 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  Meds: Percocet, Coumadin,  (prescribed but has not taken yet)    SEEN BY FELLOW: 02-03-14: update meds, surgery went well  03-08-2014: doing well, go slow--watch swelling, xray today    Insurance/Certification information:   [facility]  Physician Information:  Referring Practitioner: Urbano Heir  Plan of care signed (Y/N): yes  Visit# / total visits:  6  Pain level: 4/10 at rest; 5/10 at worst      G-Code (if applicable):      Date / Visit # G-Code Applied: Mar 08, 2014 / Visit #6  PT G-Codes  Functional Assessment Tool Used: LEFS  Score: 48/80 = 60%  Functional Limitation: Mobility: Walking and moving around  Mobility: Walking and Moving Around Current Status (V4098): At least 40 percent but less than 60 percent impaired, limited or restricted    Next Progress Note due by: Visit #10           Restrictions:       Subjective: Feeling better. Was able to sleep through the night during the last 2 nights without waking up to take pain meds. Did not do any exercise other than ankle pumps all weekend. Has venous doppler following PT appointment today.    Objective:    Functional Scale:  LEFS 33/80 = 41% > 59% impaired (pre-op 01/28/14), 10/80 = 13% > 83% impaired (post-op 02/03/14); 48/80 = 60% > 40% impaired (2014/03/08)    Observation:  ??? Quad Recruitment: fair    ??? Effusion: mild visible swelling - improving    ??? Wound Appearance: healing well, no signs of infection, steri strips replaced this visit    ??? Palpation: NT; generalized to knee    ??? Gait: midly stiff-legged gait, no Ad    ??? Other:     03/08/2014  ROM PROM AROM Overpressure Comment    L R L R L R    Flexion 120 95     ERMI   Extension 0 -2  -1     Prop   QS                          03-08-2014  Strength L R Comment   Quad 5/5  NT 2/2 sx   Hamstring 5/5     Gastroc 5/5     Hip flexors 5/5     Hip abduction 5/5       2014-03-08  Special Test Results/Comment   Homans Neg   Temp Neg     2014/03/08  Girth L R   Mid Patella 46.0 45.4   Suprapatellar 47.8 46.8   5cm above 49.5 59.6   15cm above       Exercises:  Exercise/Equipment Resistance/Repetitions Other comments   Stretching     Hamstring 5 x 30"    Towel Pull 5 x 30"    Inclined Calf     Hip Flexion     ITB     Groin     Quad 5 x 30" Prone w/ strap        SLR     Supine 3 x 10 Quad lag   Abduction 3 x  10    Adduction 2 x 10 x 10" PS   Prone     SLR+          Isometrics     Quad sets 2 x 10 x 10" A/S        Patellar Glides     Medial 3    Superior 3    Inferior 3         ROM     Sheet Pulls     Hang Weights     Passive 10 x 10" EOB w/ well leg   Active 10x  EOB   Weight Shift     Ankle Pumps     ERMI 5 x 30'         CKC     Calf raises 3 x 10    Wall sits     Step ups     1 leg stand     Squatting 3x10 mini   CC TKE     Balance          PRE     Extension 3 x 10 RANGE: SAQ, 0#   Flexion 3 x 10 RANGE: Avail., 0#   Leg Press  RANGE:        Other     Gait training               Other Therapeutic Activities:    Home Exercise Program:   Initial PO exercises. Written and verbal instructions were provided to patient. Updated 02/10/14.    Patient Education: OOT Clinical biochemist Treatments:   patellar mobilization    Modalities:    []  hot packs  []  EMS   []  Ultrasound  []  ice   [x]  vasopneumatic         [x]  high volt/EGS  []  phono  []  tens    []  ionto  []  autorange/biodex []  Interferential  []  other    Timed Code Treatment Minutes: 75'    Total Treatment Minutes: 26'    Assessment:    [x]  Patient tolerated treatment well [x]  Patient limited by fatigue  []  Patient limited by pain  []  Patient limited by other medical complications  [x]  Other: Pt has made good progress in the last week. Demonstrating near normal gait without crutches, good progression with ROM and  tolerance to normal progression of strengthening exercises. Pt demonstrated good understanding of HEP and OOT as he transfers to PT closer to home.     Plan:       ?    [x]  Continue per plan of care []  Alter current plan (see comments)  []  Plan of care initiated []  Hold pending MD visit []  Discharge    Rehab Potential: []  Excellent [x]  Good []  Fair  []  Poor    Frequency/Duration: daily while in town  # Days per week: []  1 day # Weeks: []  1 week []  5 weeks     []  2 days?   []  2 weeks []  6 weeks     []  3 days   []  3 weeks []  7 weeks     [x]  4 days   []  4 weeks [x]  8 weeks      Plan for next session: Progress per protocol and pt tolerance.     MD follow up: as needed    Goals  Short term goals  Time Frame for Short term goals: 1 week  Short term goal 1: Pt will be independent with  HEP    MET  Short term goal 2: Pt will have proper PWB gait mechanics with B/L crutches   MET  Short term goal 3: Pt will demonstrate understanding of PO instructions   MET  Long term goals  Time Frame for Long term goals : 4-6 weeks  Long term goal 1: Pt will have 0-115 degrees of knee PROM  Long term goal 2: Pt will be able to ambulate with proper FWB mechanics without AD  Long term goal 3: Pt will have at least 9% improvement from post-op LEFS       Electronically Signed By:  Ambrose Pancoast, PT

## 2014-02-11 NOTE — Progress Notes (Signed)
Radiograph 2 views, AP and lateral right knee excellent appearance to the right total knee after surgery.  Ointment positioning and cement lines are all within normal standards.  This has an excellent appearance status post TKR surgery    Board Certified Orthopaedic Surgeon  President and Medical Director  [employer]

## 2014-02-12 NOTE — Addendum Note (Signed)
Addended byLunette Stands on: 02/12/2014 09:11 AM     Modules accepted: Orders

## 2014-02-20 ENCOUNTER — Encounter

## 2014-02-20 MED ORDER — OXYCODONE-ACETAMINOPHEN 5-325 MG PO TABS
5-325 MG | ORAL_TABLET | ORAL | Status: AC | PRN
Start: 2014-02-20 — End: 2015-02-20

## 2014-03-25 ENCOUNTER — Encounter

## 2017-03-30 ENCOUNTER — Emergency Department (HOSPITAL_COMMUNITY): Payer: 59

## 2017-03-30 ENCOUNTER — Emergency Department (HOSPITAL_COMMUNITY)
Admission: EM | Admit: 2017-03-30 | Discharge: 2017-03-30 | Disposition: A | Discharge status: Home / Self Care | Payer: 59 | Attending: Emergency Medicine | Admitting: Emergency Medicine

## 2017-03-30 DIAGNOSIS — R42 Dizziness and giddiness: Secondary | ICD-10-CM | POA: Insufficient documentation

## 2017-03-30 DIAGNOSIS — Z7982 Long term (current) use of aspirin: Secondary | ICD-10-CM | POA: Insufficient documentation

## 2017-03-30 DIAGNOSIS — Z79899 Other long term (current) drug therapy: Secondary | ICD-10-CM | POA: Diagnosis not present

## 2017-03-30 DIAGNOSIS — I10 Essential (primary) hypertension: Secondary | ICD-10-CM | POA: Insufficient documentation

## 2017-03-30 LAB — CBC WITH DIFFERENTIAL/PLATELET
BASOS ABS: 0 10*3/uL (ref 0.0–0.1)
BASOS PCT: 0 %
EOS PCT: 0 %
Eosinophils Absolute: 0 10*3/uL (ref 0.0–0.7)
HEMATOCRIT: 40.3 % (ref 39.0–52.0)
Hemoglobin: 13.8 g/dL (ref 13.0–17.0)
LYMPHS PCT: 14 %
Lymphs Abs: 1 10*3/uL (ref 0.7–4.0)
MCH: 30.7 pg (ref 26.0–34.0)
MCHC: 34.2 g/dL (ref 30.0–36.0)
MCV: 89.8 fL (ref 78.0–100.0)
Monocytes Absolute: 0.5 10*3/uL (ref 0.1–1.0)
Monocytes Relative: 6 %
NEUTROS ABS: 5.6 10*3/uL (ref 1.7–7.7)
Neutrophils Relative %: 80 %
PLATELETS: 252 10*3/uL (ref 150–400)
RBC: 4.49 MIL/uL (ref 4.22–5.81)
RDW: 12.7 % (ref 11.5–15.5)
WBC: 7 10*3/uL (ref 4.0–10.5)

## 2017-03-30 LAB — BASIC METABOLIC PANEL
ANION GAP: 11 (ref 5–15)
BUN: 23 mg/dL — ABNORMAL HIGH (ref 6–20)
CALCIUM: 9 mg/dL (ref 8.9–10.3)
CO2: 24 mmol/L (ref 22–32)
Chloride: 104 mmol/L (ref 101–111)
Creatinine, Ser: 0.73 mg/dL (ref 0.61–1.24)
GLUCOSE: 160 mg/dL — AB (ref 65–99)
POTASSIUM: 3.8 mmol/L (ref 3.5–5.1)
Sodium: 139 mmol/L (ref 135–145)

## 2017-03-30 MED ORDER — SODIUM CHLORIDE 0.9 % IV BOLUS (SEPSIS)
1000.0000 mL | Freq: Once | INTRAVENOUS | Status: AC
  Administered 2017-03-30: 1000 mL via INTRAVENOUS

## 2017-03-30 MED ORDER — MECLIZINE HCL 25 MG PO TABS: 25.0000 mg | ORAL_TABLET | Freq: Three times a day (TID) | ORAL | 0 refills | Status: AC | PRN

## 2017-03-30 MED ORDER — ONDANSETRON HCL 4 MG/2ML IJ SOLN
4.0000 mg | Freq: Once | INTRAMUSCULAR | Status: AC
  Administered 2017-03-30: 4 mg via INTRAVENOUS
  Filled 2017-03-30: qty 2

## 2017-03-30 MED ORDER — AZITHROMYCIN 250 MG PO TABS: 250.0000 mg | ORAL_TABLET | Freq: Every day | ORAL | 0 refills | Status: AC

## 2017-03-30 MED ORDER — MECLIZINE HCL 25 MG PO TABS
12.5000 mg | ORAL_TABLET | Freq: Once | ORAL | Status: AC
  Administered 2017-03-30: 12.5 mg via ORAL
  Filled 2017-03-30: qty 1

## 2017-03-30 NOTE — ED Triage Notes (Signed)
Pt was on plane prior to take off when pt had sudden onset of dizziness and diaphoresis. Per EMS personnel pt presented the same upon their arrival as well as hypertensive and RBB noted on EKG. Pt states he had N/V but denies any pain or hx of similar events.

## 2017-03-30 NOTE — ED Provider Notes (Signed)
Please see previous physicians note regarding patient's presenting history and physical, initial ED course, and associated medical decision making.  CT head visualizes is no acute intracranial processes. Low suspicion for central etiology of her recurrent as normal neurological exam, and he has been ambulatory. He doesn't significantly state that he has had sinusitis for the past 3-4 weeks, this may be etiology of his vertigo. Will treat with course of azithromycin. Discharged with meclizine as well. Strict return and follow-up instructions reviewed. He expressed understanding of all discharge instructions and felt comfortable with the plan of care.     Lavera Guise, MD 03/30/17 [phone]

## 2017-03-30 NOTE — ED Provider Notes (Signed)
[facility]-EMERGENCY DEPT Provider Note   CSN: [id number] Arrival date & time: 03/30/17  1144     History   Chief Complaint Chief Complaint  Patient presents with  . Dizziness    HPI Walter Sanders is a 65 y.o. male.  HPI Pt is here on a business trip.  He got on the plane today in [place].  Pt started to have mild dizziness, vertigo while looking down reading.  He then started to become diaphoretic.  Pt felt like he was having vertigo.  He denies that he has any room spinning sensation, he does not feel like he is spinning but feels that he has vertigo.  When he got off the plane he felt very dizzy.  He made it to his appointment but it got worse so he called 911.  He did vomit at the office. He has noticed sensitivity to motion in the past.   2 weeks ago developed some sinus symptoms.    Has had stroke workups in the past including MRI that were negative. Sx are improving while waiting.   No past medical history on file.  There are no active problems to display for this patient.   No past surgical history on file.     Home Medications    Prior to Admission medications   Medication Sig Start Date End Date Taking? Authorizing Provider  Ascorbic Acid (VITAMIN C PO) Take 1 tablet by mouth daily.    Yes [provider]  aspirin EC 81 MG tablet Take 81 mg by mouth daily.   Yes [provider]  budesonide (PULMICORT) 0.25 MG/2ML nebulizer solution Take 0.25 mg by nebulization 2 (two) times daily as needed (for shortness of breath).   Yes [provider]  Burton Apley Oil (CHIA SEED OIL EXTRACT PO) Take 10 mLs by mouth daily.   Yes [provider]  Cholecalciferol (VITAMIN D PO) Take 1 tablet by mouth daily.   Yes [provider]  Flaxseed, Linseed, (FLAX SEEDS PO) Take 15 mLs by mouth daily.   Yes [provider]  FLUoxetine (PROZAC) 10 MG capsule Take 30 mg by mouth daily. 02/04/17  Yes [provider]  HONEY PO Take 15  mLs by mouth daily.   Yes [provider]  LORazepam (ATIVAN) 0.5 MG tablet Take 0.5 mg by mouth daily as needed for anxiety. 03/11/17  Yes [provider]  losartan (COZAAR) 50 MG tablet Take 50 mg by mouth daily. 12/30/16  Yes [provider]  methylphenidate (RITALIN) 20 MG tablet Take 10 mg by mouth 3 (three) times daily. 03/25/17  Yes [provider]  Multiple Vitamin (MULTIVITAMIN WITH MINERALS) TABS tablet Take 1 tablet by mouth daily.   Yes [provider]  Multiple Vitamins-Minerals (ZINC PO) Take 1 tablet by mouth daily.   Yes [provider]  omeprazole (PRILOSEC OTC) 20 MG tablet Take 20 mg by mouth daily.   Yes [provider]  OVER THE COUNTER MEDICATION Take 5 tablets by mouth 2 (two) times daily. *Colustum*   Yes [provider]  oxymetazoline (DRISTAN SPRAY) 0.05 % nasal spray Place 1 spray into both nostrils 2 (two) times daily as needed for congestion.   Yes [provider]  simvastatin (ZOCOR) 40 MG tablet Take 40 mg by mouth daily. 03/01/17  Yes [provider]  TURMERIC PO Take 2 tablets by mouth daily.   Yes [provider]    Family History No family history on file.  Social History Social History  Substance Use Topics  . Smoking status: Not on file  . Smokeless tobacco: Not on file  . Alcohol use Not on file     Allergies   Patient has no known allergies.   Review of Systems Review of Systems  Constitutional: Negative for fever.  Respiratory: Negative for wheezing.   Cardiovascular: Negative for chest pain.  Neurological: Positive for light-headedness. Negative for tremors, speech difficulty, weakness, numbness and headaches.  All other systems reviewed and are negative.    Physical Exam Updated Vital Signs BP (!) 167/86 (BP Location: Right Arm)   Pulse 71   Temp (!) 97.4 F (36.3 C) (Oral)   Resp 16   Ht 1.778 m ( )   Wt 111.1 kg (245 lb)   SpO2  97%   BMI 35.15 kg/m   Physical Exam  Constitutional: He is oriented to person, place, and time. He appears well-developed and well-nourished. No distress.  HENT:  Head: Normocephalic and atraumatic.  Right Ear: External ear normal.  Left Ear: External ear normal.  Mouth/Throat: Oropharynx is clear and moist.  Eyes: Conjunctivae are normal. Right eye exhibits no discharge. Left eye exhibits no discharge. No scleral icterus.  Neck: Neck supple. No tracheal deviation present.  Cardiovascular: Normal rate, regular rhythm and intact distal pulses.   Pulmonary/Chest: Effort normal and breath sounds normal. No stridor. No respiratory distress. He has no wheezes. He has no rales.  Abdominal: Soft. Bowel sounds are normal. He exhibits no distension. There is no tenderness. There is no rebound and no guarding.  Musculoskeletal: He exhibits no edema or tenderness.  Neurological: He is alert and oriented to person, place, and time. He has normal strength. No cranial nerve deficit (no facial droop, extraocular movements intact, no slurred speech) or sensory deficit. He exhibits normal muscle tone. He displays no seizure activity. Coordination normal.  No pronator drift bilateral upper extrem, able to hold both legs off bed for 5 seconds, sensation intact in all extremities, no visual field cuts, no left or right sided neglect, normal finger-nose exam bilaterally, no nystagmus noted   Skin: Skin is warm and dry. No rash noted.  Psychiatric: He has a normal mood and affect.  Nursing note and vitals reviewed.    ED Treatments / Results  Labs (all labs ordered are listed, but only abnormal results are displayed) Labs Reviewed  BASIC METABOLIC PANEL - Abnormal; Notable for the following:       Result Value   Glucose, Bld 160 (*)    BUN 23 (*)    All other components within normal limits  CBC WITH DIFFERENTIAL/PLATELET    EKG  EKG Interpretation  Date/Time:  Thursday March 30 2017 12:04:39  EDT Ventricular Rate:  71 PR Interval:    QRS Duration: 151 QT Interval:  455 QTC Calculation: 495 R Axis:   -60 Text Interpretation:  Sinus rhythm Atrial premature complex RBBB and LAFB Left ventricular hypertrophy Baseline wander in lead(s) V6 No old tracing to compare Confirmed by Linwood Dibbles [phone]) on 03/30/2017 12:42:26 PM       Radiology No results found.  Procedures Procedures (including critical care time)  Medications Ordered in ED Medications  meclizine (ANTIVERT) tablet 12.5 mg (12.5 mg Oral Given 03/30/17 1335)  sodium chloride 0.9 % bolus 1,000 mL (0 mLs Intravenous Stopped 03/30/17 1405)  ondansetron (ZOFRAN) injection 4 mg (4 mg Intravenous Given 03/30/17 1335)     Initial Impression / Assessment and Plan / ED  Course  I have reviewed the triage vital signs and the nursing notes.  Pertinent labs & imaging results that were available during my care of the patient were reviewed by me and considered in my medical decision making (see chart for details).  Clinical Course as of Mar 30 1633  Thu Mar 30, 2017  1407 EKG 12-Lead [TH]  1408 EKG 12-Lead [TH]  1606 Pt is unable to tolerate the MRI.  Pt had one before but it sounds like it was an open MRI  [JK]  1626 Confirmed there is no open MRI at [facility].  We cannot have that done here.  Pt has a normal neuro exam.  Will ct to assess for acute abnormalities.    [JK]    Clinical Course User Index [JK] Linwood Dibbles, MD [TH] Higginns, French Ana, Student-PA    Pt is feeling better but not completely resolved.  He is hypertensive.  Stroke TIA is a potential concern.  Will MRI brain to rule out stroke.  Pt is claustrophobic.  Cannot tolerate the MRI.  Does not want to try with sedation.  Will CT.   No focal neuro findings on exam.  Cannot exclude a stroke with the CT.  Pt understands.  We can rule out bleed or large mass. Final Clinical Impressions(s) / ED Diagnoses   Final diagnoses:  Vertigo  Hypertension, unspecified type     New Prescriptions New Prescriptions   No medications on file     Linwood Dibbles, MD 03/30/17 [phone]

## 2017-03-30 NOTE — ED Notes (Signed)
Bed: WA20 Expected date:  Expected time:  Means of arrival:  Comments: EMS-dizzy 

## 2017-03-30 NOTE — ED Notes (Signed)
Pt ambulatory to restroom at this time. NAD noted.

## 2017-03-30 NOTE — Discharge Instructions (Signed)
Please return without fail for worsening symptoms, including fever, confusion, inability to walk, intractable vomiting or any other symptoms concerning to you.  Your Ct scan was normal. Your blood work is reassuring.

## 2019-02-25 IMAGING — CT CT HEAD W/O CM
3 of 4 series · 16 of 47 positions shown, 19 images · non-contrast
Comparison: None.

CLINICAL DATA: Acute ataxia today, stroke suspected. Dizziness,
vertigo, and diaphoresis.

EXAM:
CT HEAD WITHOUT CONTRAST
TECHNIQUE: Contiguous axial images were obtained from the base of the skull
through the vertex without intravenous contrast.

[Series 2: head w/o · axial · non-contrast · 0.44mm/px · z∈[-91,+49]mm · 10 of 34 slices shown, 13 images]
[im 3/34  brain]
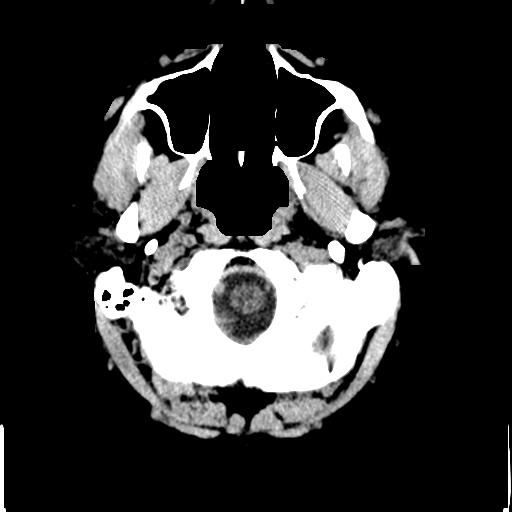
[im 3/34  bone]
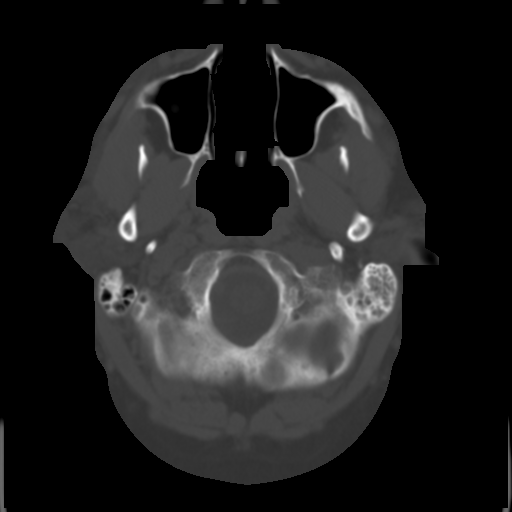
[im 5/34  brain]
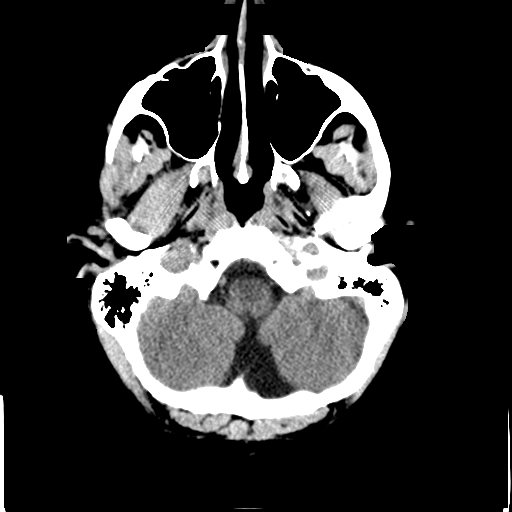
[im 10/34  brain]
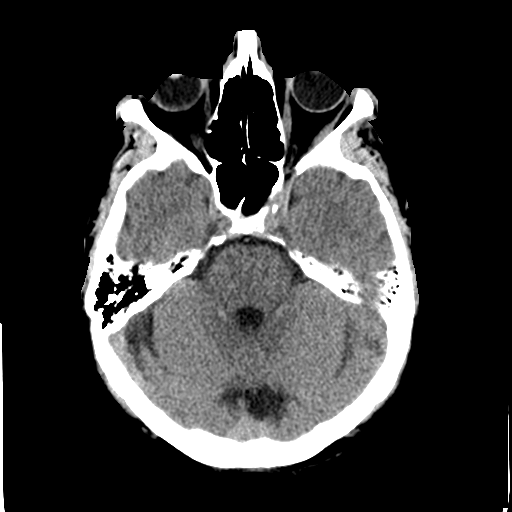
[im 12/34  brain]
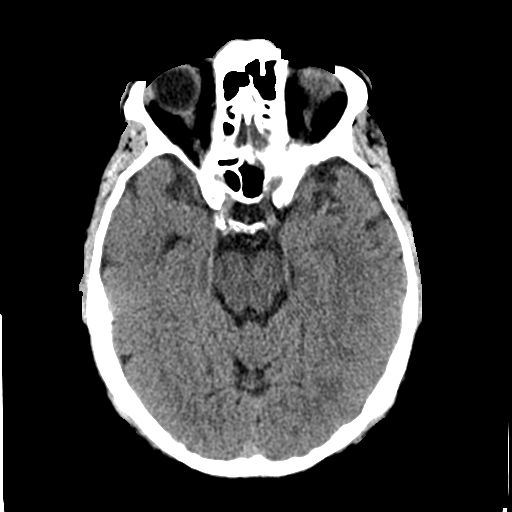
[im 15/34  brain]
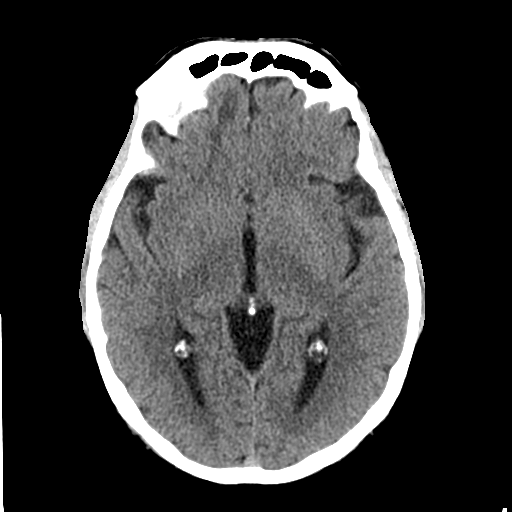
[im 15/34  bone]
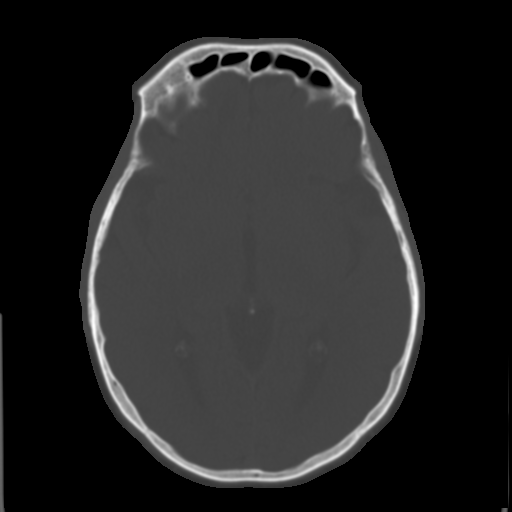
[im 19/34  brain]
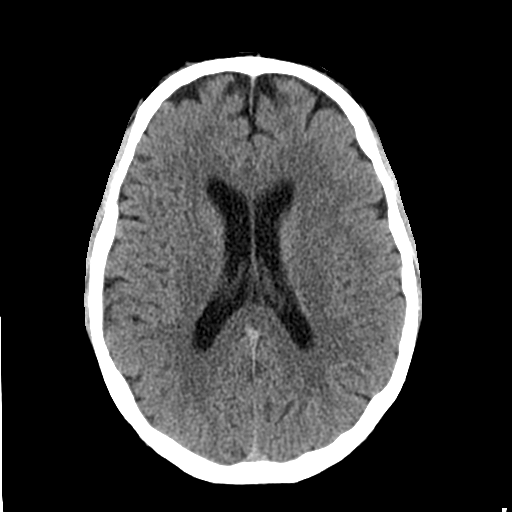
[im 22/34  brain]
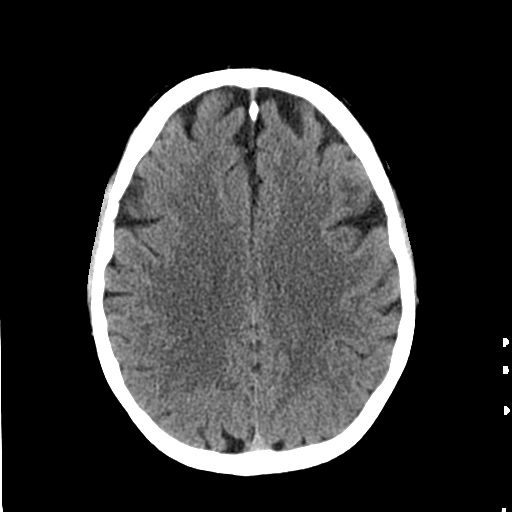
[im 24/34  brain]
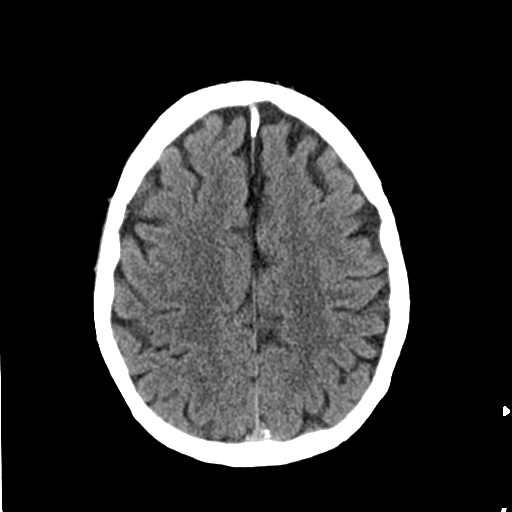
[im 29/34  brain]
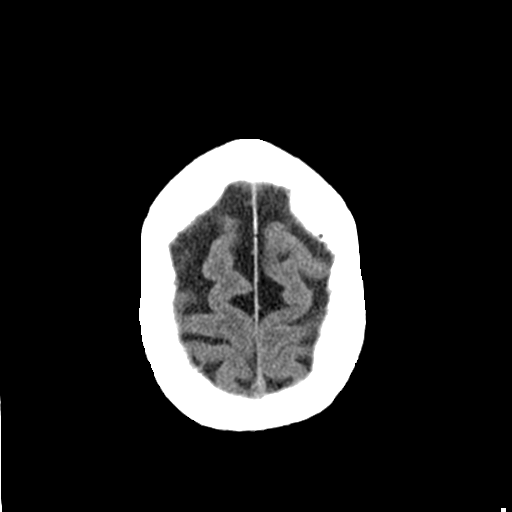
[im 29/34  bone]
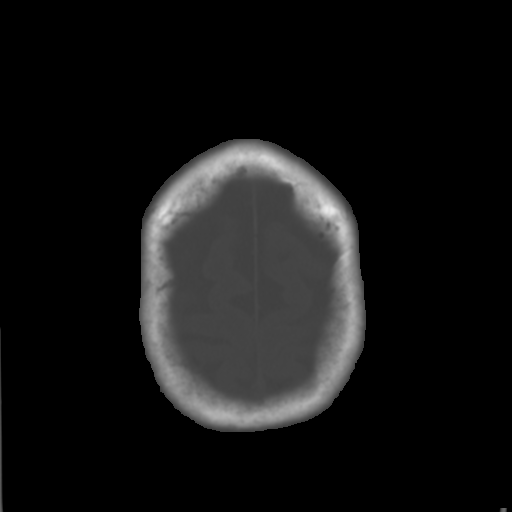
[im 31/34  brain]
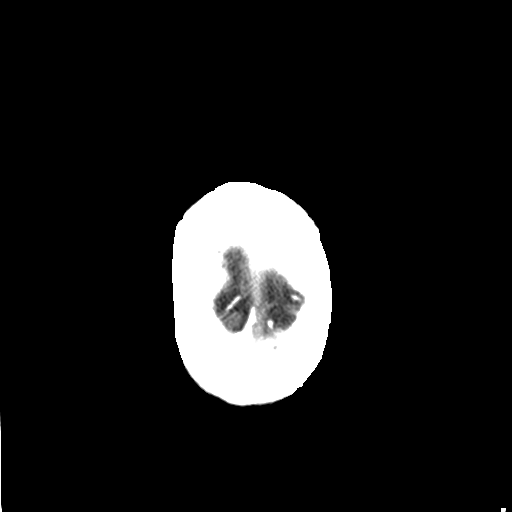

[Series 5: coronal · coronal · 0.33mm/px · 3 of 68 slices shown]
[im 23/68  brain]
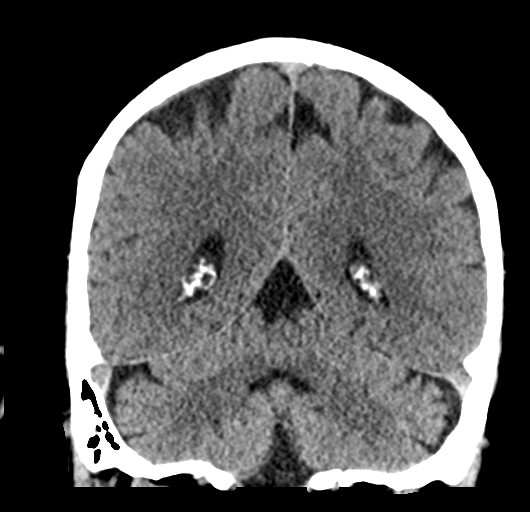
[im 30/68  brain]
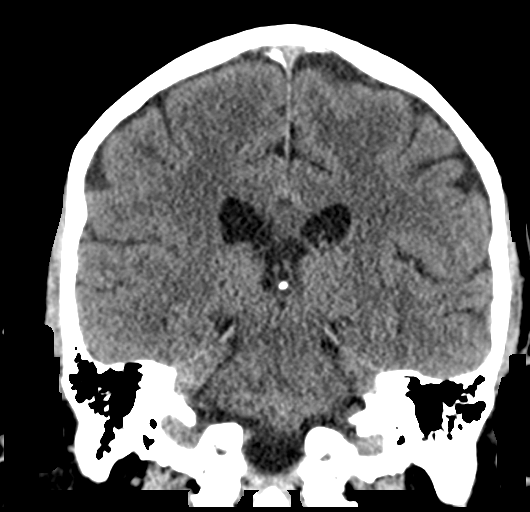
[im 38/68  brain]
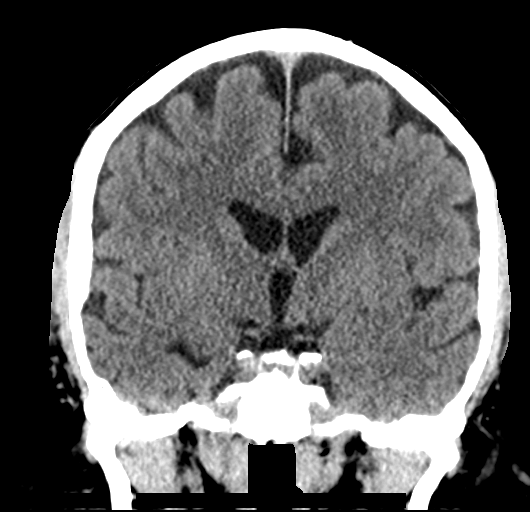

[Series 6: sagittal · sagittal · 0.33mm/px · 3 of 59 slices shown]
[im 20/59  brain]
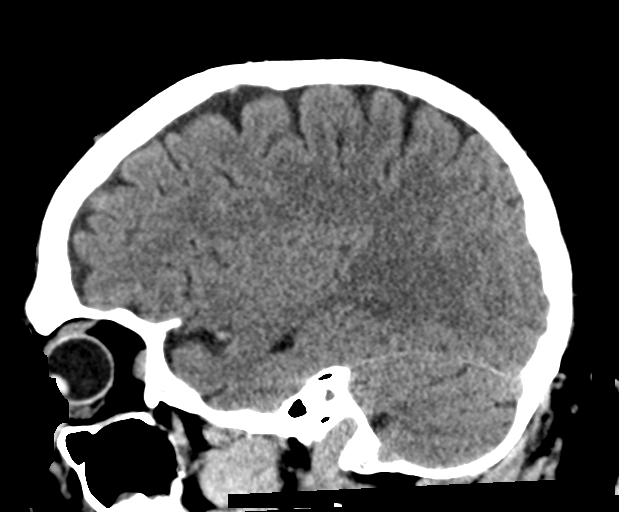
[im 30/59  brain]
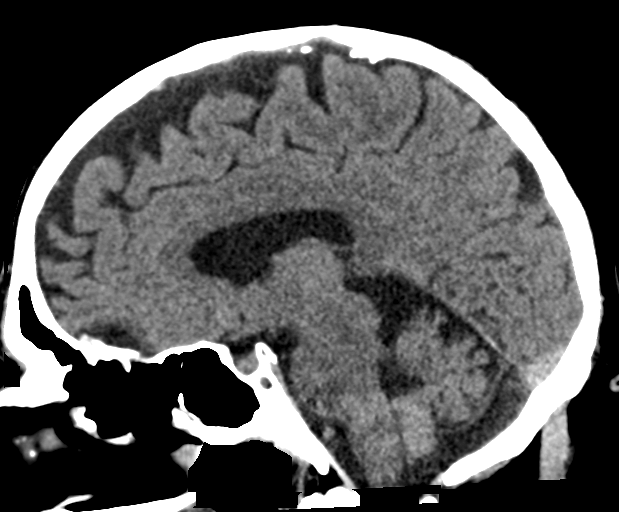
[im 39/59  brain]
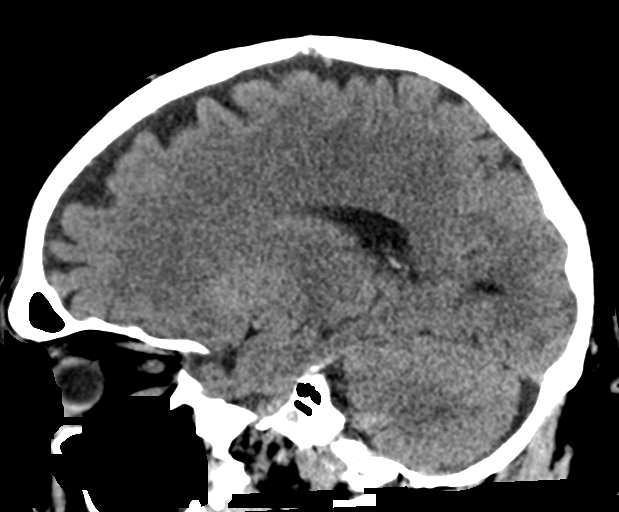

[16 of 47 positions shown; findings below may reference images not displayed]

FINDINGS: Brain: No evidence of acute infarction, hemorrhage, hydrocephalus,
extra-axial collection, or mass lesion/mass effect. Mild diffuse
cerebral atrophy.

Vascular:  No hyperdense vessel or other acute findings.

Skull: No evidence of fracture or other significant bone
abnormality.

Sinuses/Orbits:  No acute findings.

Other: None.
IMPRESSION: No acute intracranial abnormality.  Mild cerebral atrophy.
